# Patient Record
Sex: Male | Born: 1975 | Race: White | Hispanic: No | Marital: Married | Smoking: Never smoker
Health system: Southern US, Community
[De-identification: ages and names within clinical notes are randomized; demographics above are authoritative.]

## PROBLEM LIST (undated history)

## (undated) DIAGNOSIS — I10 Essential (primary) hypertension: Secondary | ICD-10-CM

## (undated) HISTORY — PX: ANTERIOR CRUCIATE LIGAMENT REPAIR: SHX115

## (undated) HISTORY — PX: ORIF RADIAL SHAFT FRACTURE: SUR957

## (undated) HISTORY — PX: OTHER SURGICAL HISTORY: SHX169

## (undated) HISTORY — PX: SMALL INTESTINE SURGERY: SHX150

---

## 2011-03-23 ENCOUNTER — Observation Stay (HOSPITAL_COMMUNITY)
Admission: EM | Admit: 2011-03-23 | Discharge: 2011-03-23 | Disposition: A | Discharge status: Home / Self Care | Payer: BC Managed Care – PPO | Attending: Emergency Medicine | Admitting: Emergency Medicine

## 2011-03-23 ENCOUNTER — Encounter (HOSPITAL_COMMUNITY): Payer: Self-pay | Admitting: Emergency Medicine

## 2011-03-23 DIAGNOSIS — T7840XA Allergy, unspecified, initial encounter: Principal | ICD-10-CM | POA: Insufficient documentation

## 2011-03-23 DIAGNOSIS — I10 Essential (primary) hypertension: Secondary | ICD-10-CM | POA: Insufficient documentation

## 2011-03-23 HISTORY — DX: Essential (primary) hypertension: I10

## 2011-03-23 LAB — POCT I-STAT, CHEM 8
BUN: 15 mg/dL (ref 6–23)
Chloride: 107 mEq/L (ref 96–112)
HCT: 51 % (ref 39.0–52.0)
Sodium: 140 mEq/L (ref 135–145)

## 2011-03-23 MED ORDER — DIPHENHYDRAMINE HCL 50 MG/ML IJ SOLN
50.0000 mg | Freq: Once | INTRAMUSCULAR | Status: AC
  Administered 2011-03-23: 50 mg via INTRAVENOUS
  Filled 2011-03-23: qty 1

## 2011-03-23 MED ORDER — ALBUTEROL (5 MG/ML) CONTINUOUS INHALATION SOLN
10.0000 mg/h | INHALATION_SOLUTION | RESPIRATORY_TRACT | Status: AC
  Administered 2011-03-23: 10 mg/h via RESPIRATORY_TRACT
  Filled 2011-03-23: qty 20

## 2011-03-23 MED ORDER — EPINEPHRINE 0.3 MG/0.3ML IJ DEVI
0.3000 mg | Freq: Once | INTRAMUSCULAR | Status: AC
  Administered 2011-03-23: 0.3 mg via INTRAMUSCULAR
  Filled 2011-03-23: qty 0.3

## 2011-03-23 MED ORDER — EPINEPHRINE 0.3 MG/0.3ML IJ DEVI: 0.3000 mg | INTRAMUSCULAR | Status: AC | PRN

## 2011-03-23 MED ORDER — SODIUM CHLORIDE 0.9 % IV BOLUS (SEPSIS)
1000.0000 mL | Freq: Once | INTRAVENOUS | Status: AC
  Administered 2011-03-23: 1000 mL via INTRAVENOUS

## 2011-03-23 MED ORDER — ALBUTEROL SULFATE (5 MG/ML) 0.5% IN NEBU
INHALATION_SOLUTION | RESPIRATORY_TRACT | Status: AC
  Administered 2011-03-23: 10 mg/h via RESPIRATORY_TRACT
  Filled 2011-03-23: qty 2

## 2011-03-23 MED ORDER — PREDNISONE 10 MG PO TABS: 20.0000 mg | ORAL_TABLET | Freq: Every day | ORAL | Status: DC

## 2011-03-23 MED ORDER — FAMOTIDINE IN NACL 20-0.9 MG/50ML-% IV SOLN
20.0000 mg | Freq: Once | INTRAVENOUS | Status: AC
  Administered 2011-03-23: 20 mg via INTRAVENOUS
  Filled 2011-03-23: qty 50

## 2011-03-23 MED ORDER — METHYLPREDNISOLONE SODIUM SUCC 125 MG IJ SOLR
125.0000 mg | Freq: Once | INTRAMUSCULAR | Status: AC
  Administered 2011-03-23: 125 mg via INTRAVENOUS
  Filled 2011-03-23: qty 2

## 2011-03-23 NOTE — ED Provider Notes (Signed)
Patient report received from my attending who has seen and evaluated the patient. 36 year old male presenting to the ED with an apparent allergic reaction to possible ACE inhibitor. The allergic reaction involve oral mucosa with swelling to lips and tongue.  Questionable airway involvement.  My attending has initiated allergic rxn protocol.  Pt currently in no apparent distress after initial treatment (pepcid, epi, solu-medrol, nebs, IVF, benadryl). On exam, lips with moderate angioedema, tongue mildly edematous, Heart with tachycardia but no M/R/G, lung CTAB, abd soft and nontender.   Pt will be moved to CDU on allergic RXN protocol for the next 6-8 hrs.  I discuss pt care with Felicie Morn, NP who will continue care  Fayrene Helper, PA-C 03/23/11 1532

## 2011-03-23 NOTE — ED Provider Notes (Signed)
Medical screening examination/treatment/procedure(s) were conducted as a shared visit with non-physician practitioner(s) and myself.  I personally evaluated the patient during the encounter   Lavette Yankovich A. Patrica Duel, MD 03/24/11 1958

## 2011-03-23 NOTE — ED Notes (Signed)
Dinner tray ordered. Ok per EDP

## 2011-03-23 NOTE — ED Provider Notes (Signed)
Angioedema has subsided.  Patient feeling better.  No sensation of tongue or oropharyngeal swelling.  Lungs CTA bilaterally.  Patient states he is ready for discharge home.  Will provide prescription for epipen. Patient to follow-up with his PCP [facility]) in the next few days.  Jimmye Norman, NP 03/23/11 2055

## 2011-03-23 NOTE — ED Provider Notes (Signed)
History     CSN: [id number]  Arrival date & time 03/23/11  1253   First MD Initiated Contact with Patient 03/23/11 1254      Chief Complaint  Patient presents with  . Allergic Reaction   this 36 year old man was upstairs visiting a family member in the hospital when he began to note symptoms suggestive of allergic reaction. He had diffuse redness in his arms and legs. He also began to have swelling in his face including his tongue and lips. He has been recently taking ibuprofen for her knee problem. He also is on a blood pressure medication, which he does not know the name of, but states she has not taken this in 2 weeks. Unknown whether or not. This may have been an ACE inhibitor. 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 He denies any chest pain, denies any difficulty with breathing, but appears to be somewhat uncomfortable, no acute distress. Initial vital signs are within normal limits other than mild tachycardia.  (Consider location/radiation/quality/duration/timing/severity/associated sxs/prior treatment) HPI  No past medical history on file.  No past surgical history on file.  No family history on file.  History  Substance Use Topics  . Smoking status: Not on file  . Smokeless tobacco: Not on file  . Alcohol Use: Not on file      Review of Systems  All other systems reviewed and are negative.    Allergies  Review of patient's allergies indicates not on file.  Home Medications  No current outpatient prescriptions on file.  BP 95/67  Pulse 125  Temp(Src) 98.2 F (36.8 C) (Oral)  Resp 18  SpO2 97%  Physical Exam  Nursing note and vitals reviewed. Constitutional: He is oriented to person, place, and time. He appears well-developed and well-nourished.       Morbidly obese, uncomfortable, but in no acute distress  HENT:  Head: Normocephalic and atraumatic.       Swelling of the lips and tongue, which is mild to  moderate. Mild puffiness around the eyes as well. The airway appears to be patent at this time  Eyes: Conjunctivae and EOM are normal. Pupils are equal, round, and reactive to light.  Neck: Neck supple.  Cardiovascular: Normal rate and regular rhythm.  Exam reveals no gallop and no friction rub.   No murmur heard. Pulmonary/Chest: Breath sounds normal. He has no wheezes. He has no rales. He exhibits no tenderness.  Abdominal: Soft. Bowel sounds are normal. He exhibits no distension. There is no tenderness. There is no rebound and no guarding.  Musculoskeletal: Normal range of motion.  Neurological: He is alert and oriented to person, place, and time. No cranial nerve deficit. Coordination normal.  Skin: Skin is warm and dry. Rash noted.       Mild to moderate diffuse erythema to the extremities.  Psychiatric: He has a normal mood and affect.    ED Course  Procedures (including critical care time)   Labs Reviewed  I-STAT, CHEM 8   No results found.   No diagnosis found.    MDM  Pt is seen and examined;  Initial history and physical completed.  Will follow.   CRITICAL CARE Performed by: Orlene Och.   Total critical care time: 30  Critical care time was exclusive of separately billable procedures and treating other patients.  Critical care was necessary to treat or prevent imminent or life-threatening deterioration.  Critical care was time spent personally by  me on the following activities: development of treatment plan with patient and/or surrogate as well as nursing, discussions with consultants, evaluation of patient's response to treatment, examination of patient, obtaining history from patient or surrogate, ordering and performing treatments and interventions, ordering and review of laboratory studies, ordering and review of radiographic studies, pulse oximetry and re-evaluation of patient's condition.      2:02 PM  Patient is showing improvement. Will place on  the CDU, allergic reaction protocol.  Will need further evaluation and observation   Results for orders placed during the hospital encounter of 03/23/11  POCT I-STAT, CHEM 8      Component Value Range   Sodium 140  135 - 145 (mEq/L)   Potassium 3.9  3.5 - 5.1 (mEq/L)   Chloride 107  96 - 112 (mEq/L)   BUN 15  6 - 23 (mg/dL)   Creatinine, Ser 1.61  0.50 - 1.35 (mg/dL)   Glucose, Bld 096 (*) 70 - 99 (mg/dL)   Calcium, Ion 0.45 (*) 1.12 - 1.32 (mmol/L)   TCO2 22  0 - 100 (mmol/L)   Hemoglobin 17.3 (*) 13.0 - 17.0 (g/dL)   HCT 40.9  81.1 - 91.4 (%)   No results found.     Simrit Gohlke A. Patrica Duel, MD 03/23/11 7829

## 2011-03-23 NOTE — ED Notes (Signed)
Visiting a family member in the hospital and developed itching in hands and bottom of feet,started breaking out with hives and lips and tongue started to swell,then developed difficulty breathing.

## 2011-03-24 NOTE — ED Provider Notes (Signed)
Medical screening examination/treatment/procedure(s) were conducted as a shared visit with non-physician practitioner(s) and myself.  I personally evaluated the patient during the encounter   Dominique Calvey A. Najee Cowens, MD 03/24/11 1459 

## 2017-08-25 ENCOUNTER — Ambulatory Visit (HOSPITAL_COMMUNITY)
Admission: EM | Admit: 2017-08-25 | Discharge: 2017-08-25 | Disposition: A | Discharge status: Home / Self Care | Payer: PRIVATE HEALTH INSURANCE | Attending: Emergency Medicine | Admitting: Emergency Medicine

## 2017-08-25 ENCOUNTER — Emergency Department (HOSPITAL_COMMUNITY): Payer: PRIVATE HEALTH INSURANCE | Admitting: Anesthesiology

## 2017-08-25 ENCOUNTER — Encounter (HOSPITAL_COMMUNITY): Payer: Self-pay | Admitting: Emergency Medicine

## 2017-08-25 ENCOUNTER — Emergency Department (HOSPITAL_COMMUNITY): Payer: PRIVATE HEALTH INSURANCE

## 2017-08-25 ENCOUNTER — Encounter (HOSPITAL_COMMUNITY)
Admission: EM | Disposition: A | Discharge status: Home / Self Care | Payer: Self-pay | Source: Home / Self Care | Attending: Emergency Medicine

## 2017-08-25 DIAGNOSIS — Z7982 Long term (current) use of aspirin: Secondary | ICD-10-CM | POA: Diagnosis not present

## 2017-08-25 DIAGNOSIS — Z79899 Other long term (current) drug therapy: Secondary | ICD-10-CM | POA: Insufficient documentation

## 2017-08-25 DIAGNOSIS — J95830 Postprocedural hemorrhage and hematoma of a respiratory system organ or structure following a respiratory system procedure: Secondary | ICD-10-CM | POA: Insufficient documentation

## 2017-08-25 DIAGNOSIS — E119 Type 2 diabetes mellitus without complications: Secondary | ICD-10-CM | POA: Insufficient documentation

## 2017-08-25 DIAGNOSIS — Z7984 Long term (current) use of oral hypoglycemic drugs: Secondary | ICD-10-CM | POA: Diagnosis not present

## 2017-08-25 DIAGNOSIS — I10 Essential (primary) hypertension: Secondary | ICD-10-CM | POA: Diagnosis not present

## 2017-08-25 HISTORY — PX: TONSILLECTOMY: SHX5217

## 2017-08-25 LAB — CBC WITH DIFFERENTIAL/PLATELET
Abs Immature Granulocytes: 0.2 10*3/uL — ABNORMAL HIGH (ref 0.0–0.1)
Basophils Absolute: 0.1 10*3/uL (ref 0.0–0.1)
Basophils Relative: 0 %
EOS ABS: 0.2 10*3/uL (ref 0.0–0.7)
EOS PCT: 1 %
HEMATOCRIT: 45.2 % (ref 39.0–52.0)
HEMOGLOBIN: 14.9 g/dL (ref 13.0–17.0)
Immature Granulocytes: 1 %
LYMPHS ABS: 4 10*3/uL (ref 0.7–4.0)
LYMPHS PCT: 21 %
MCH: 29.4 pg (ref 26.0–34.0)
MCHC: 33 g/dL (ref 30.0–36.0)
MCV: 89.2 fL (ref 78.0–100.0)
MONO ABS: 1.6 10*3/uL — AB (ref 0.1–1.0)
Monocytes Relative: 8 %
Neutro Abs: 12.7 10*3/uL — ABNORMAL HIGH (ref 1.7–7.7)
Neutrophils Relative %: 69 %
Platelets: 294 10*3/uL (ref 150–400)
RBC: 5.07 MIL/uL (ref 4.22–5.81)
RDW: 12.5 % (ref 11.5–15.5)
WBC: 18.8 10*3/uL — ABNORMAL HIGH (ref 4.0–10.5)

## 2017-08-25 LAB — BASIC METABOLIC PANEL
Anion gap: 10 (ref 5–15)
BUN: 11 mg/dL (ref 6–20)
CALCIUM: 9 mg/dL (ref 8.9–10.3)
CHLORIDE: 94 mmol/L — AB (ref 101–111)
CO2: 29 mmol/L (ref 22–32)
CREATININE: 0.92 mg/dL (ref 0.61–1.24)
GFR calc Af Amer: >60 mL/min (ref >60)
GFR calc non Af Amer: >60 mL/min (ref >60)
GLUCOSE: 207 mg/dL — AB (ref 65–99)
Potassium: 3.7 mmol/L (ref 3.5–5.1)
Sodium: 133 mmol/L — ABNORMAL LOW (ref 135–145)

## 2017-08-25 LAB — GLUCOSE, CAPILLARY: Glucose-Capillary: 206 mg/dL — ABNORMAL HIGH (ref 65–99)

## 2017-08-25 SURGERY — TONSILLECTOMY
Anesthesia: General

## 2017-08-25 MED ORDER — DEXTROSE 5 % IV SOLN
3.0000 g | Freq: Once | INTRAVENOUS | Status: DC
  Filled 2017-08-25: qty 3000

## 2017-08-25 MED ORDER — PROPOFOL 10 MG/ML IV BOLUS
INTRAVENOUS | Status: AC
  Filled 2017-08-25: qty 40

## 2017-08-25 MED ORDER — DEXTROSE 5 % IV SOLN
INTRAVENOUS | Status: DC | PRN
  Administered 2017-08-25: 3 g via INTRAVENOUS

## 2017-08-25 MED ORDER — DEXTROSE 5 % IV SOLN: 3000.0000 mg | Freq: Once | INTRAVENOUS | Status: DC

## 2017-08-25 MED ORDER — DEXAMETHASONE SODIUM PHOSPHATE 10 MG/ML IJ SOLN
INTRAMUSCULAR | Status: DC | PRN
  Administered 2017-08-25: 10 mg via INTRAVENOUS

## 2017-08-25 MED ORDER — FENTANYL CITRATE (PF) 250 MCG/5ML IJ SOLN
INTRAMUSCULAR | Status: AC
  Filled 2017-08-25: qty 5

## 2017-08-25 MED ORDER — LACTATED RINGERS IV SOLN
INTRAVENOUS | Status: DC | PRN
  Administered 2017-08-25: 16:00:00 via INTRAVENOUS

## 2017-08-25 MED ORDER — LIDOCAINE HCL (CARDIAC) PF 100 MG/5ML IV SOSY
PREFILLED_SYRINGE | INTRAVENOUS | Status: DC | PRN
  Administered 2017-08-25: 60 mg via INTRATRACHEAL

## 2017-08-25 MED ORDER — PROPOFOL 10 MG/ML IV BOLUS
INTRAVENOUS | Status: DC | PRN
  Administered 2017-08-25: 80 mg via INTRAVENOUS
  Administered 2017-08-25: 200 mg via INTRAVENOUS

## 2017-08-25 MED ORDER — DEXAMETHASONE SODIUM PHOSPHATE 10 MG/ML IJ SOLN: 10.0000 mg | Freq: Once | INTRAMUSCULAR | Status: DC

## 2017-08-25 MED ORDER — CEFAZOLIN SODIUM-DEXTROSE 1-4 GM/50ML-% IV SOLN
INTRAVENOUS | Status: AC
  Filled 2017-08-25: qty 50

## 2017-08-25 MED ORDER — 0.9 % SODIUM CHLORIDE (POUR BTL) OPTIME
TOPICAL | Status: DC | PRN
  Administered 2017-08-25: 1000 mL

## 2017-08-25 MED ORDER — MIDAZOLAM HCL 2 MG/2ML IJ SOLN
INTRAMUSCULAR | Status: AC
  Filled 2017-08-25: qty 2

## 2017-08-25 MED ORDER — FENTANYL CITRATE (PF) 250 MCG/5ML IJ SOLN
INTRAMUSCULAR | Status: DC | PRN
  Administered 2017-08-25: 50 ug via INTRAVENOUS

## 2017-08-25 MED ORDER — SUCCINYLCHOLINE CHLORIDE 20 MG/ML IJ SOLN
INTRAMUSCULAR | Status: DC | PRN
  Administered 2017-08-25: 20 mg via INTRAVENOUS
  Administered 2017-08-25: 120 mg via INTRAVENOUS

## 2017-08-25 MED ORDER — CEFAZOLIN SODIUM-DEXTROSE 2-4 GM/100ML-% IV SOLN
INTRAVENOUS | Status: AC
  Filled 2017-08-25: qty 100

## 2017-08-25 MED ORDER — FENTANYL CITRATE (PF) 100 MCG/2ML IJ SOLN: 25.0000 ug | INTRAMUSCULAR | Status: DC | PRN

## 2017-08-25 MED ORDER — MIDAZOLAM HCL 5 MG/5ML IJ SOLN
INTRAMUSCULAR | Status: DC | PRN
  Administered 2017-08-25: 1 mg via INTRAVENOUS

## 2017-08-25 MED ORDER — ONDANSETRON HCL 4 MG/2ML IJ SOLN
INTRAMUSCULAR | Status: DC | PRN
  Administered 2017-08-25: 4 mg via INTRAVENOUS

## 2017-08-25 SURGICAL SUPPLY — 30 items
BLADE SURG 15 STRL LF DISP TIS (BLADE) IMPLANT
BLADE SURG 15 STRL SS (BLADE)
CATH ROBINSON RED A/P 12FR (CATHETERS) IMPLANT
CLEANER TIP ELECTROSURG 2X2 (MISCELLANEOUS) IMPLANT
COAGULATOR SUCT 6 FR SWTCH (ELECTROSURGICAL) ×1
COAGULATOR SUCT SWTCH 10FR 6 (ELECTROSURGICAL) ×2 IMPLANT
DRAPE HALF SHEET 40X57 (DRAPES) IMPLANT
ELECT COATED BLADE 2.86 ST (ELECTRODE) ×3 IMPLANT
ELECT REM PT RETURN 9FT ADLT (ELECTROSURGICAL) ×3
ELECT REM PT RETURN 9FT PED (ELECTROSURGICAL)
ELECTRODE REM PT RETRN 9FT PED (ELECTROSURGICAL) IMPLANT
ELECTRODE REM PT RTRN 9FT ADLT (ELECTROSURGICAL) ×1 IMPLANT
GAUZE SPONGE 4X4 16PLY XRAY LF (GAUZE/BANDAGES/DRESSINGS) ×3 IMPLANT
GLOVE BIOGEL M 7.0 STRL (GLOVE) ×3 IMPLANT
GOWN STRL REUS W/ TWL LRG LVL3 (GOWN DISPOSABLE) ×2 IMPLANT
GOWN STRL REUS W/TWL LRG LVL3 (GOWN DISPOSABLE) ×4
KIT BASIN OR (CUSTOM PROCEDURE TRAY) ×3 IMPLANT
KIT TURNOVER KIT B (KITS) ×3 IMPLANT
NEEDLE HYPO 25GX1X1/2 BEV (NEEDLE) IMPLANT
NS IRRIG 1000ML POUR BTL (IV SOLUTION) ×3 IMPLANT
PACK SURGICAL SETUP 50X90 (CUSTOM PROCEDURE TRAY) ×3 IMPLANT
PAD ARMBOARD 7.5X6 YLW CONV (MISCELLANEOUS) ×6 IMPLANT
PENCIL BUTTON HOLSTER BLD 10FT (ELECTRODE) ×3 IMPLANT
SPECIMEN JAR SMALL (MISCELLANEOUS) IMPLANT
SPONGE TONSIL 1 RF SGL (DISPOSABLE) ×3 IMPLANT
SYR BULB 3OZ (MISCELLANEOUS) ×3 IMPLANT
TOWEL NATURAL 10PK STERILE (DISPOSABLE) ×3 IMPLANT
TOWEL OR 17X24 6PK STRL BLUE (TOWEL DISPOSABLE) IMPLANT
TUBE SALEM SUMP 16 FR W/ARV (TUBING) ×3 IMPLANT
WATER STERILE IRR 1000ML POUR (IV SOLUTION) IMPLANT

## 2017-08-25 NOTE — Transfer of Care (Signed)
Immediate Anesthesia Transfer of Care Note  Patient: Hayden Burke  Procedure(s) Performed: CONTROL OF POST TONSILLAR BLEED (N/A )  Patient Location: PACU  Anesthesia Type:General  Level of Consciousness: awake  Airway & Oxygen Therapy: Patient Spontanous Breathing  Post-op Assessment: Report given to RN and Post -op Vital signs reviewed and stable  Post vital signs: Reviewed and stable  Last Vitals:  Vitals Value Taken Time  BP 144/96 08/25/2017  5:22 PM  Temp    Pulse 97 08/25/2017  5:27 PM  Resp 15 08/25/2017  5:27 PM  SpO2 93 % 08/25/2017  5:27 PM  Vitals shown include unvalidated device data.  Last Pain:  Vitals:   08/25/17 1516  PainSc: 6          Complications: No apparent anesthesia complications

## 2017-08-25 NOTE — ED Triage Notes (Signed)
Pt states he had a tonsillectomy on Friday, has not been bleeding since then but had a coughing fit and then started bleeding, which made him vomit a large amount of blood. No bleeding currently. Pt felt dizzy during the episode but does not currently.

## 2017-08-25 NOTE — ED Notes (Signed)
Pt st's throat was bleeding a lot at home.  No active bleeding present at this time

## 2017-08-25 NOTE — Op Note (Signed)
Operative Note: Control post tonsillectomy hemorrhage  Patient: Hayden Burke  Medical record number: [id number]  Date:08/25/2017  Pre-operative Indications: Acute oropharyngeal hemorrhage  Postoperative Indications: Same  Surgical Procedure: Control post tonsillectomy hemorrhage  Anesthesia: GET  Surgeon: Barbee Coughavid L Luvern Mcisaac, M.D.  Assist: None  Complications: None  EBL: None   Brief History: The patient is a 42 y.o. male with a history of significant tonsil hypertrophy and newly diagnosed obstructive sleep apnea.  The patient [facility] on 08/23/2017.  He developed acute hemorrhage and [facility] for evaluation.. Given the patient's history and findings I recommended lamination under anesthesia and control of post tonsillectomy hemorrhage under general anesthesia, risks and benefits were discussed in detail with the patient and her family. They understand and agree with our plan for surgery which is [facility] on an emergency basis.  Surgical Procedure: The patient is brought to the operating room on 08/25/2017 and placed in supine position on the operating table. General endotracheal anesthesia was established without difficulty. When the patient was adequately anesthetized, surgical timeout was performed and correct identification of the patient and the surgical procedure. The patient was positioned and prepped and draped in sterile fashion.  With the patient's airway stable a Crowe-Davis mouthgag was inserted without difficulty, there were no loose or broken teeth.  The patient had significant clotting in the oropharynx and tonsillar fossa bilaterally.  This was gently removed and an arterial bleeding site was noted on the inferior aspect of the left tonsil fossa.  This was thoroughly cauterized with monopolar suction cautery.  The tonsillar fossa were then gently abraded with  a dry tonsil sponge and several other areas of point hemorrhage were cauterized.  The mouthgag was released and reapplied, no active bleeding.  An orogastric tube was then passed in the patient's stomach contents were aspirated including significant swallowed blood.  A warm saline gastric lavage was then undertaken with a proximate 200 cc of saline to clear the stomach of additional clotted material.  The patient was awakened from anesthetic and transferred from the operating room to the recovery room in stable condition. There were no complications and blood loss was minimal.   Barbee Coughavid L Dvante Hands, M.D. [facility] 08/25/2017

## 2017-08-25 NOTE — Anesthesia Preprocedure Evaluation (Signed)
Anesthesia Evaluation  Patient identified by MRN, date of birth, ID band Patient awake    Reviewed: Allergy & Precautions, NPO status , Patient's Chart, lab work & pertinent test results  Airway Mallampati: II  TM Distance: >3 FB     Dental   Pulmonary    breath sounds clear to auscultation       Cardiovascular hypertension,  Rhythm:Regular Rate:Normal     Neuro/Psych    GI/Hepatic negative GI ROS, Neg liver ROS,   Endo/Other  diabetes  Renal/GU negative Renal ROS     Musculoskeletal   Abdominal   Peds  Hematology   Anesthesia Other Findings   Reproductive/Obstetrics                             Anesthesia Physical Anesthesia Plan  ASA: III  Anesthesia Plan: General   Post-op Pain Management:    Induction: Intravenous, Rapid sequence and Cricoid pressure planned  PONV Risk Score and Plan: Treatment may vary due to age or medical condition, Ondansetron, Dexamethasone and Midazolam  Airway Management Planned: Oral ETT and Video Laryngoscope Planned  Additional Equipment:   Intra-op Plan:   Post-operative Plan: Possible Post-op intubation/ventilation  Informed Consent: I have reviewed the patients History and Physical, chart, labs and discussed the procedure including the risks, benefits and alternatives for the proposed anesthesia with the patient or authorized representative who has indicated his/her understanding and acceptance.   Dental advisory given  Plan Discussed with: CRNA, Anesthesiologist and Surgeon  Anesthesia Plan Comments:         Anesthesia Quick Evaluation

## 2017-08-25 NOTE — Anesthesia Procedure Notes (Signed)
Procedure Name: Intubation Date/Time: 08/25/2017 4:55 PM Performed by: Claudina LickMahony, Avir Deruiter D, CRNA Pre-anesthesia Checklist: Patient identified, Emergency Drugs available, Suction available, Patient being monitored and Timeout performed Patient Re-evaluated:Patient Re-evaluated prior to induction Oxygen Delivery Method: Circle system utilized Preoxygenation: Pre-oxygenation with 100% oxygen Induction Type: IV induction, Rapid sequence and Cricoid Pressure applied Laryngoscope Size: Glidescope, Miller and 2 (Grade 2 with Glidescope- unable to manipulate ETT past large clot. Grade 2/3 with Hyacinth MeekerMiller 2- able to pass ETT) Grade View: Grade II Tube type: Oral Tube size: 7.5 mm Number of attempts: 2 (See note) Airway Equipment and Method: Stylet and Video-laryngoscopy Placement Confirmation: ETT inserted through vocal cords under direct vision,  positive ETCO2 and breath sounds checked- equal and bilateral Secured at: 23 cm Tube secured with: Tape Dental Injury: Teeth and Oropharynx as per pre-operative assessment

## 2017-08-25 NOTE — Anesthesia Postprocedure Evaluation (Signed)
Anesthesia Post Note  Patient: Hayden Burke  Procedure(s) Performed: CONTROL OF POST TONSILLAR BLEED (N/A )     Patient location during evaluation: PACU Anesthesia Type: General Level of consciousness: awake Pain management: pain level controlled Vital Signs Assessment: post-procedure vital signs reviewed and stable Respiratory status: spontaneous breathing Cardiovascular status: stable Anesthetic complications: no    Last Vitals:  Vitals:   08/25/17 1750 08/25/17 1752  BP:  119/75  Pulse:  (!) 104  Resp:  19  Temp: 37.8 C   SpO2:  93%    Last Pain:  Vitals:   08/25/17 1750  PainSc: 3                  Hayden Burke

## 2017-08-25 NOTE — ED Provider Notes (Signed)
[facility] EMERGENCY DEPARTMENT Provider Note   CSN: [id number] Arrival date & time: 08/25/17  1449     History   Chief Complaint No chief complaint on file.   HPI Hayden NickelRusty Dorsch is a 42 y.o. male.  HPI   Hayden Burke is a 42 y.o. male, with a history of DM and HTN, presenting to the ED with bleeding from the throat that began shortly prior to arrival.  Patient underwent tonsillectomy performed by Dr. Nicanor BakeA. Tanner with [facility] on June 14.  Patient had no bleeding until this afternoon.  States he began to cough and then  he had a large amount of bleeding.  The bleeding slowed and stopped prior to checking into the ED.  Early into his ED course, however, he began to bleed again.  Denies LOC, increased throat pain, shortness of breath, chest pain, fever, or any other complaints.      Past Medical History:  Diagnosis Date  . Diabetes mellitus   . Hypertension     Patient Active Problem List   Diagnosis Date Noted  . Post-tonsillectomy hemorrhage 08/25/2017          Home Medications    Prior to Admission medications   Medication Sig Start Date End Date Taking? Authorizing Provider  acetaminophen (TYLENOL) 325 MG tablet Take 650 mg by mouth every 4 (four) hours as needed. For pain.    [provider]  aspirin EC 81 MG tablet Take 81 mg by mouth daily as needed. For pain.    [provider]  EPINEPHrine (EPIPEN) 0.3 mg/0.3 mL DEVI Inject 0.3 mLs (0.3 mg total) into the muscle as needed. 03/23/11   Felicie MornSmith, David, NP  hydrochlorothiazide (HYDRODIURIL) 25 MG tablet Take 25 mg by mouth daily.    [provider]  ibuprofen (ADVIL,MOTRIN) 200 MG tablet Take 800 mg by mouth every 6 (six) hours as needed. For pain.    [provider]  lisinopril (PRINIVIL,ZESTRIL) 20 MG tablet Take 20 mg by mouth daily.    [provider]  metFORMIN (GLUCOPHAGE-XR) 500 MG 24 hr tablet Take 1,000 mg by mouth daily.     [provider]  predniSONE (DELTASONE) 10 MG tablet Take 2 tablets (20 mg total) by mouth daily. 03/23/11   Felicie MornSmith, David, NP  ranitidine (ZANTAC) 150 MG tablet Take 150 mg by mouth daily.    [provider]    Family History History reviewed. No pertinent family history.  Social History Social History   Tobacco Use  . Smoking status: Never Smoker  Substance Use Topics  . Alcohol use: Yes  . Drug use: Not on file     Allergies   Ace inhibitors; Erythromycin; Food; and Sulfa antibiotics   Review of Systems Review of Systems  Constitutional: Negative for diaphoresis and fever.  HENT:       Bleeding from tonsillectomy site  Respiratory: Positive for cough. Negative for shortness of breath.   Cardiovascular: Negative for chest pain.  Gastrointestinal: Positive for nausea and vomiting. Negative for abdominal pain.  All other systems reviewed and are negative.    Physical Exam Updated Vital Signs BP 126/77   Pulse 98   Temp 98.3 F (36.8 C)   Resp 19   Ht 6\' 1"  (1.854 m)   Wt 115.7 kg (255 lb)   SpO2 99%   BMI 33.64 kg/m   Physical Exam  Constitutional: He appears well-developed and well-nourished. No distress.  HENT:  Head: Normocephalic  and atraumatic.  Patient has active bleeding that appears to be originating from the right tonsillectomy site.  He is able to control his own airway.  Eyes: Conjunctivae are normal.  Neck: Neck supple.  Cardiovascular: Normal rate, regular rhythm, normal heart sounds and intact distal pulses.  Pulmonary/Chest: Effort normal and breath sounds normal. No respiratory distress.  Abdominal: Soft. There is no tenderness. There is no guarding.  Musculoskeletal: He exhibits no edema.  Lymphadenopathy:    He has no cervical adenopathy.  Neurological: He is alert.  Skin: Skin is warm and dry. He is not diaphoretic.  Psychiatric: He has a normal mood and affect. His behavior is normal.  Nursing note and vitals  reviewed.    ED Treatments / Results  Labs (all labs ordered are listed, but only abnormal results are displayed) Labs Reviewed  CBC WITH DIFFERENTIAL/PLATELET - Abnormal; Notable for the following components:      Result Value   WBC 18.8 (*)    Neutro Abs 12.7 (*)    Monocytes Absolute 1.6 (*)    Abs Immature Granulocytes 0.2 (*)    All other components within normal limits  BASIC METABOLIC PANEL - Abnormal; Notable for the following components:   Sodium 133 (*)    Chloride 94 (*)    Glucose, Bld 207 (*)    All other components within normal limits  GLUCOSE, CAPILLARY - Abnormal; Notable for the following components:   Glucose-Capillary 206 (*)    All other components within normal limits    EKG None  Radiology Dg Chest Portable 1 View  Result Date: 08/25/2017 CLINICAL DATA:  42 y/o M; post tonsillectomy bleeding with concern for aspiration. EXAM: PORTABLE CHEST 1 VIEW COMPARISON:  None. FINDINGS: Normal cardiac silhouette given projection and technique. Bronchitic changes within the left lung base may represent aspiration. No consolidation. No pleural effusion or pneumothorax. Bones are unremarkable. IMPRESSION: Bronchitic changes in the left lung base may represent aspiration. No consolidation. Electronically Signed   By: Mitzi Hansen M.D.   On: 08/25/2017 16:03    Procedures Procedures (including critical care time)  Medications Ordered in ED Medications  ceFAZolin (ANCEF) 1-4 GM/50ML-% IVPB (has no administration in time range)  ceFAZolin (ANCEF) 2-4 GM/100ML-% IVPB (has no administration in time range)  dexamethasone (DECADRON) injection 10 mg ( Intravenous Automatically Held 08/25/17 1745)  fentaNYL (SUBLIMAZE) injection 25-50 mcg (has no administration in time range)     Initial Impression / Assessment and Plan / ED Course  I have reviewed the triage vital signs and the nursing notes.  Pertinent labs & imaging results that were available during my  care of the patient were reviewed by me and considered in my medical decision making (see chart for details).  Clinical Course as of Aug 25 1799  Sun Aug 25, 2017  1545 Spoke with Dr. Annalee Genta.  States he will take the patient to the OR.  Requests CBC and BMP, no type and screen necessary. He will meet the patient in the pre-op area to discuss the procedure.   [SJ]    Clinical Course User Index [SJ] Joy, Shawn C, PA-C    Patient presents with bleeding from his tonsillectomy site.  Able to control his airway in the ED, however, he will be taken to the OR by ENT to address the hemorrhage.  Findings and plan of care discussed with Loren Racer, MD. Dr. Ranae Palms personally evaluated and examined this patient.     Final Clinical Impressions(s) / ED  Diagnoses   Final diagnoses:  Hemorrhage following tonsillectomy    ED Discharge Orders        Ordered    Increase activity slowly     08/25/17 1722    Diet - low sodium heart healthy     08/25/17 1722    Discharge instructions    Comments:  Tonsillectomy Care After Refer to this sheet in the next few weeks. These instructions provide you with information on caring for yourself after your procedure. Your caregiver may also give you specific instructions. Your treatment has been planned according to current medical practices, but problems sometimes occur. Call your caregiver if you have any problems or questions after your procedure. HOME CARE INSTRUCTIONS  Obtain proper rest, keeping your head elevated at all times. You will feel worn out and tired for a while.  Drink plenty of fluids. This reduces pain and hastens the healing process.  Only take over-the-counter or prescription medicines for pain, discomfort, or fever as directed by your caregiver. Do not take aspirin or nonsteroidal anti-inflammatory drugs. These medications increase the possibility of bleeding.  Sometimes the use of pain medication can cause constipation. If this  happens, ask your caregiver about laxatives that you can take.  When eating, only eat a small portion of your food and then take your prescribed pain medication. Eat the remainder of your food 45 minutes later. This will make swallowing less painful.  Soft and cold foods, such as gelatin, sherbet, ice cream, frozen ice pops, and cold drinks, are usually the easiest to eat. Several days after surgery, you will be able to eat more solid food.  Avoid mouth washes and gargles.  Avoid contact with people who have upper respiratory infections, such as colds and sore throats.  An ice pack applied to your neck may help with discomfort and keep swelling down.  SEEK MEDICAL CARE IF:  You have increasing pain that is not controlled with medications.  You have an oral temperature above 102 F (38.9 C).  You feel lightheaded or have a fainting spell.  You develop a rash.  SEEK IMMEDIATE MEDICAL CARE IF:  You have difficulty breathing.  You experience side effects or allergic reactions to medications.  You bleed bright red blood from your throat, or you vomit bright red blood.  MAKE SURE YOU: Understand these instructions.  Will watch your condition.  Will get help right away if you are not doing well or get worse.   Alternate ibuprofen and Tylenol every 6 hours as needed for pain management.  CALL your primary ENT surgeon at [facility] for any questions or Emergency concerns.  Follow-up as scheduled at [facility] for postoperative recheck.   08/25/17 1722       Anselm Pancoast, PA-C 08/25/17 1802    Loren Racer, MD 08/25/17 1815

## 2017-08-25 NOTE — Consult Note (Signed)
ENT CONSULT:  Reason for Consult: Acute post tonsillectomy hemorrhage Referring Physician: EDP  Hayden Burke is an 42 y.o. male.  HPI: The patient presents to the [facility] emergency department with acute hemorrhage.  He underwent tonsillectomy on 08/23/2017 at [facility].  The patient has a history of significant tonsillar hypertrophy and recurrent infection.  He was diagnosed with moderately severe obstructive sleep apnea.  The patient developed acute hemorrhage earlier today with oropharyngeal bleeding.  He was brought by EMS to [facility] emergency department for evaluation and work-up.  Past Medical History:  Diagnosis Date  . Diabetes mellitus   . Hypertension       No family history on file.  Social History:  reports that he has never smoked. He does not have any smokeless tobacco history on file. He reports that he drinks alcohol. His drug history is not on file.  Allergies:  Allergies  Allergen Reactions  . Ace Inhibitors   . Erythromycin Hives and Nausea And Vomiting  . Food     Strawberries.  . Sulfa Antibiotics     Medications: I have reviewed the patient's current medications.  Results for orders placed or performed during the hospital encounter of 08/25/17 (from the past 48 hour(s))  CBC with Differential     Status: Abnormal   Collection Time: 08/25/17  3:47 PM  Result Value Ref Range   WBC 18.8 (H) 4.0 - 10.5 K/uL   RBC 5.07 4.22 - 5.81 MIL/uL   Hemoglobin 14.9 13.0 - 17.0 g/dL   HCT 45.2 39.0 - 52.0 %   MCV 89.2 78.0 - 100.0 fL   MCH 29.4 26.0 - 34.0 pg   MCHC 33.0 30.0 - 36.0 g/dL   RDW 12.5 11.5 - 15.5 %   Platelets 294 150 - 400 K/uL   Neutrophils Relative % 69 %   Neutro Abs 12.7 (H) 1.7 - 7.7 K/uL   Lymphocytes Relative 21 %   Lymphs Abs 4.0 0.7 - 4.0 K/uL   Monocytes Relative 8 %   Monocytes Absolute 1.6 (H) 0.1 - 1.0 K/uL   Eosinophils Relative 1 %   Eosinophils Absolute 0.2 0.0 - 0.7 K/uL   Basophils Relative 0 %    Basophils Absolute 0.1 0.0 - 0.1 K/uL   Immature Granulocytes 1 %   Abs Immature Granulocytes 0.2 (H) 0.0 - 0.1 K/uL    Comment: Performed at [facility], [address] [place], [place] [address]  Basic metabolic panel     Status: Abnormal   Collection Time: 08/25/17  3:47 PM  Result Value Ref Range   Sodium 133 (L) 135 - 145 mmol/L   Potassium 3.7 3.5 - 5.1 mmol/L   Chloride 94 (L) 101 - 111 mmol/L   CO2 29 22 - 32 mmol/L   Glucose, Bld 207 (H) 65 - 99 mg/dL   BUN 11 6 - 20 mg/dL   Creatinine, Ser 0.92 0.61 - 1.24 mg/dL   Calcium 9.0 8.9 - 10.3 mg/dL   GFR calc non Af Amer >60 >60 mL/min   GFR calc Af Amer >60 >60 mL/min    Comment: (NOTE) The eGFR has been calculated using the CKD EPI equation. This calculation has not been validated in all clinical situations. eGFR's persistently <60 mL/min signify possible Chronic Kidney Disease.    Anion gap 10 5 - 15    Comment: Performed at [facility] [address] [place], [place] [address]  Dg Chest Portable 1 View  Result Date: 08/25/2017 CLINICAL DATA:  42 y/o M; post tonsillectomy bleeding with concern for aspiration. EXAM: PORTABLE CHEST 1 VIEW COMPARISON:  None. FINDINGS: Normal cardiac silhouette given projection and technique. Bronchitic changes within the left lung base may represent aspiration. No consolidation. No pleural effusion or pneumothorax. Bones are unremarkable. IMPRESSION: Bronchitic changes in the left lung base may represent aspiration. No consolidation. Electronically Signed   By: Kristine Garbe M.D.   On: 08/25/2017 16:03    ROS:ROS 12 systems reviewed and negative except as stated in HPI   Blood pressure 128/85, pulse 99, temperature 98.3 F (36.8 C), resp. rate (!) 23, height 6' 1"  (1.854 m), weight 115.7 kg (255 lb), SpO2 96 %.  PHYSICAL EXAM: General appearance - alert, well appearing, and in no distress Mental status - alert, oriented to person, place, and time Mouth -  mucous membranes moist, pharynx normal without lesions and Post tonsillectomy pharyngeal defect with clot on the right.  Some minimal bleeding. Neck - supple, no significant adenopathy  Studies Reviewed: Blood work-normal H&H  Assessment/Plan: Patient presents to the emergency department with symptoms of acute post tonsillectomy hemorrhage.  He is 2 days after surgery performed at [facility].  The patient had significant oropharyngeal bleeding examination shows clotted blood in the right tonsillar fossa.  Given the patient's history and findings I recommended examination under anesthesia with control of his acute bleeding.  Risks and benefits of this procedure were discussed in detail with the patient is wife and they understand and agree with the plan for surgery which is scheduled on emergency basis at [facility], [place] 08/25/2017, 4:26 PM

## 2017-08-25 NOTE — ED Notes (Signed)
Pt st's he needed to cough, when he did bleeding started again.

## 2017-08-26 ENCOUNTER — Encounter (HOSPITAL_COMMUNITY): Payer: Self-pay | Admitting: Otolaryngology

## 2019-09-08 ENCOUNTER — Emergency Department
Admission: EM | Admit: 2019-09-08 | Discharge: 2019-09-08 | Disposition: A | Discharge status: Home / Self Care | Payer: No Typology Code available for payment source | Attending: Student in an Organized Health Care Education/Training Program | Admitting: Student in an Organized Health Care Education/Training Program

## 2019-09-08 ENCOUNTER — Other Ambulatory Visit: Payer: Self-pay

## 2019-09-08 ENCOUNTER — Emergency Department: Payer: No Typology Code available for payment source

## 2019-09-08 DIAGNOSIS — R1010 Upper abdominal pain, unspecified: Secondary | ICD-10-CM | POA: Insufficient documentation

## 2019-09-08 DIAGNOSIS — Y999 Unspecified external cause status: Secondary | ICD-10-CM | POA: Insufficient documentation

## 2019-09-08 DIAGNOSIS — Y92410 Unspecified street and highway as the place of occurrence of the external cause: Secondary | ICD-10-CM | POA: Diagnosis not present

## 2019-09-08 DIAGNOSIS — S20219A Contusion of unspecified front wall of thorax, initial encounter: Secondary | ICD-10-CM | POA: Insufficient documentation

## 2019-09-08 DIAGNOSIS — Z7982 Long term (current) use of aspirin: Secondary | ICD-10-CM | POA: Diagnosis not present

## 2019-09-08 DIAGNOSIS — I10 Essential (primary) hypertension: Secondary | ICD-10-CM | POA: Diagnosis not present

## 2019-09-08 DIAGNOSIS — Z79899 Other long term (current) drug therapy: Secondary | ICD-10-CM | POA: Diagnosis not present

## 2019-09-08 DIAGNOSIS — E119 Type 2 diabetes mellitus without complications: Secondary | ICD-10-CM | POA: Insufficient documentation

## 2019-09-08 DIAGNOSIS — R519 Headache, unspecified: Secondary | ICD-10-CM | POA: Diagnosis not present

## 2019-09-08 DIAGNOSIS — Z7984 Long term (current) use of oral hypoglycemic drugs: Secondary | ICD-10-CM | POA: Diagnosis not present

## 2019-09-08 DIAGNOSIS — M79642 Pain in left hand: Secondary | ICD-10-CM | POA: Diagnosis not present

## 2019-09-08 DIAGNOSIS — Y9389 Activity, other specified: Secondary | ICD-10-CM | POA: Insufficient documentation

## 2019-09-08 DIAGNOSIS — S29001A Unspecified injury of muscle and tendon of front wall of thorax, initial encounter: Secondary | ICD-10-CM | POA: Diagnosis present

## 2019-09-08 LAB — CBC WITH DIFFERENTIAL/PLATELET
Abs Immature Granulocytes: 0.07 10*3/uL (ref 0.00–0.07)
Basophils Absolute: 0.1 10*3/uL (ref 0.0–0.1)
Basophils Relative: 1 %
Eosinophils Absolute: 0.5 10*3/uL (ref 0.0–0.5)
Eosinophils Relative: 5 %
HCT: 44.2 % (ref 39.0–52.0)
Hemoglobin: 15.2 g/dL (ref 13.0–17.0)
Immature Granulocytes: 1 %
Lymphocytes Relative: 29 %
Lymphs Abs: 3 10*3/uL (ref 0.7–4.0)
MCH: 30 pg (ref 26.0–34.0)
MCHC: 34.4 g/dL (ref 30.0–36.0)
MCV: 87.2 fL (ref 80.0–100.0)
Monocytes Absolute: 0.9 10*3/uL (ref 0.1–1.0)
Monocytes Relative: 9 %
Neutro Abs: 6 10*3/uL (ref 1.7–7.7)
Neutrophils Relative %: 55 %
Platelets: 239 10*3/uL (ref 150–400)
RBC: 5.07 MIL/uL (ref 4.22–5.81)
RDW: 13 % (ref 11.5–15.5)
WBC: 10.5 10*3/uL (ref 4.0–10.5)
nRBC: 0 % (ref 0.0–0.2)

## 2019-09-08 LAB — BASIC METABOLIC PANEL
Anion gap: 10 (ref 5–15)
BUN: 11 mg/dL (ref 6–20)
CO2: 25 mmol/L (ref 22–32)
Calcium: 8.8 mg/dL — ABNORMAL LOW (ref 8.9–10.3)
Chloride: 102 mmol/L (ref 98–111)
Creatinine, Ser: 0.79 mg/dL (ref 0.61–1.24)
GFR calc Af Amer: >60 mL/min (ref >60)
GFR calc non Af Amer: >60 mL/min (ref >60)
Glucose, Bld: 103 mg/dL — ABNORMAL HIGH (ref 70–99)
Potassium: 3.5 mmol/L (ref 3.5–5.1)
Sodium: 137 mmol/L (ref 135–145)

## 2019-09-08 LAB — TROPONIN I (HIGH SENSITIVITY): Troponin I (High Sensitivity): 7 ng/L (ref <18)

## 2019-09-08 MED ORDER — IOHEXOL 300 MG/ML  SOLN
125.0000 mL | Freq: Once | INTRAMUSCULAR | Status: AC | PRN
  Administered 2019-09-08: 125 mL via INTRAVENOUS
  Filled 2019-09-08: qty 125

## 2019-09-08 MED ORDER — MELOXICAM 15 MG PO TABS: 15.0000 mg | ORAL_TABLET | Freq: Every day | ORAL | 2 refills | Status: AC

## 2019-09-08 MED ORDER — ONDANSETRON HCL 4 MG/2ML IJ SOLN
4.0000 mg | Freq: Once | INTRAMUSCULAR | Status: AC
  Administered 2019-09-08: 4 mg via INTRAVENOUS
  Filled 2019-09-08: qty 2

## 2019-09-08 MED ORDER — MORPHINE SULFATE (PF) 4 MG/ML IV SOLN
4.0000 mg | Freq: Once | INTRAVENOUS | Status: AC
  Administered 2019-09-08: 4 mg via INTRAVENOUS
  Filled 2019-09-08: qty 1

## 2019-09-08 MED ORDER — BACLOFEN 10 MG PO TABS: 10.0000 mg | ORAL_TABLET | Freq: Three times a day (TID) | ORAL | 1 refills | Status: AC

## 2019-09-08 MED ORDER — TRAMADOL HCL 50 MG PO TABS: 50.0000 mg | ORAL_TABLET | Freq: Four times a day (QID) | ORAL | 0 refills | Status: DC | PRN
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
## 2019-09-08 NOTE — Discharge Instructions (Signed)
Follow-up with either an urgent care of your company's choice if continued chest soreness or abdominal pain.  Or return to the emergency department if you feel that you are worsening.  If you have musculoskeletal pain such as neck pain back pain hand pain then follow-up with orthopedics. Take medication as prescribed.  Apply ice to all areas that hurt.  You will be sore for at least a week.  This is normal.
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
## 2022-11-19 ENCOUNTER — Encounter: Payer: Self-pay | Admitting: Internal Medicine

## 2022-11-19 ENCOUNTER — Ambulatory Visit (INDEPENDENT_AMBULATORY_CARE_PROVIDER_SITE_OTHER): Payer: Managed Care, Other (non HMO) | Admitting: Internal Medicine

## 2022-11-19 VITALS — BP 122/76 | HR 76 | Temp 97.2°F | Ht 73.0 in | Wt 285.0 lb

## 2022-11-19 DIAGNOSIS — T781XXA Other adverse food reactions, not elsewhere classified, initial encounter: Secondary | ICD-10-CM

## 2022-11-19 DIAGNOSIS — T782XXA Anaphylactic shock, unspecified, initial encounter: Secondary | ICD-10-CM

## 2022-11-19 NOTE — Patient Instructions (Addendum)
Food allergy:  - today's skin testing was negative to strawberry  - please strictly avoid strawberries for now, will get blood work to see if you may have outgrown this allergy  - for SKIN only reaction, okay to take Benadryl 50  mg  every 6 hours - for SKIN + ANY additional symptoms, OR IF concern for LIFE THREATENING reaction = Epipen Autoinjector EpiPen 0.3 mg. - If using Epinephrine autoinjector, call 911 - A food allergy action plan has been provided and discussed. - Medic Alert identification is recommended.    Drug Allergies: Legrand Pitts, Lisinopril: Avoid for now  Common ingredients to avoid between Lisinopril and Farxiga (yellow ferric oxide. magnesium stearate) , but no common ingredient for Mounjaro  Will get labs to screen for mast cell disorders  Ask PCP/Endocrinologist about next diabetic medicine they would like to add we can consider drug testing to this medication prior to starting We will look at possible desensitization regimens if one of the above medicines is absolutely indicated  Follow up: we will call you with blood work and next steps   Thank you so much for letting me partake in your care today.  Don't hesitate to reach out if you have any additional concerns!  Ferol Luz, MD  [facility] [place] [place]

## 2022-11-19 NOTE — Progress Notes (Signed)
New Patient Note  RE: Hayden Burke MRN: [id number] DOB: [birth year] Date of Office Visit: 11/19/2022  Consult requested by: Lonie Peak, PA-C Primary care provider: Lonie Peak, PA-C  Chief Complaint: Allergic Reaction  History of Present Illness: I had the pleasure of seeing Hayden Burke for initial evaluation at the [facility] on 11/19/2022. He is a 47 y.o. male, who is referred here by Lonie Peak, PA-C for the evaluation of drug allergies, food allergies .  History obtained from patient, chart review.  Drug Allergy:  Farxiga - diarrhea, vomiting, urticaria  ED visit 07/24/22:  On exam patient is slightly anxious. He has a Mallampati 3. No obvious oropharyngeal edema. No stridor. No access secretions. Lungs sound clear without wheezing. Abdomen is soft and nontender. He does have urticaria on his bilateral upper extremities.  Given subjective oropharyngeal swelling as well as nausea, vomiting, and hives patient does meet criteria for anaphylaxis. No obvious triggers except possibly some new noodles that he had last night. Thankfully he is hemodynamically stable and maintaining his airway at this time. Will give epi 0.5 mg IM as well as Pepcid 40 mg IV, methylprednisolone 125 mg IV and Zofran 4 mg IV. Will reassess shortly after epi for symptom improvement.  ED Course as of 07/24/22 1954  Tue Jul 24, 2022  0800 I reassessed the patient about 20 minutes after epi and he is having significant improvement in his breathing. Still has some hives but overall feeling much better. Feels his voice is less muffled and he is breathing normally and comfortably. This is very reassuring. Will continue to monitor for 4 hours after epi administration  1115 Patient hives have nearly completely resolved and respiratory symptoms have completely resolved for the rest of his stay after receiving epi. Will discharge with EpiPen refill and return precautions. Recommended Zyrtec  twice daily if hives return. Patient agreed to plan. Patient discharged in stable condition. "  Mounjaro -after first injection he developed nausea, second injection caused diffuse pruritus, indigestion, 3rd injection - throat swelling, hives, nausea/vomiting.    Lisinopril - urticaria, throat swelling, hives   Concern for Food Allergy:  Food of concern: strawberry History of reaction: throat swelling, urticaria,  Previous allergy testing no Eats egg, dairy, wheat, soy, fish, shellfish, peanuts, tree nuts, sesame without reactions Carries an epinephrine autoinjector: yes Has food allergy action plan no  Also reports recurrent vomiting and diarrhea occurring every 2 to 3 weeks.  Symptoms started every time he gets placed on a second diabetic medication.   Per PI from FDA:  Marcelline Deist ingredients: : microcrystalline cellulose, anhydrous lactose,  15, crospovidone, silicon dioxide, and magnesium stearate. In addition, the film coating contains the following inactive ingredients: polyvinyl alcohol, titanium dioxide, polyethylene glycol, talc, and yellow iron oxide  Mounjaro ingredients:  sodium chloride, sodium phosphate dibasic heptahydrate, and water for injection. Hydrochloric acid solution and/or sodium hydroxide solution may have been added to adjust the pH.  Lisinopril ingredients: 2.5 mg tablets - calcium phosphate, magnesium stearate, mannitol, starch. 5, 10, 20 and 30 mg tablets - calcium phosphate, magnesium stearate, mannitol, red ferric oxide, starch.  40 mg tablets - calcium phosphate, magnesium stearate, mannitol, starch, yellow ferric oxide.    Assessment and Plan: Hayden Burke is a 47 y.o. male with: Anaphylaxis, initial encounter - Plan: Tryptase  Adverse food reaction, initial encounter - Plan: Allergy Test, Allergen, Strawberry, f44   Plan: Patient Instructions  Food allergy:  - today's skin testing was negative to  strawberry  - please strictly avoid strawberries for now,  will get blood work to see if you may have outgrown this allergy  - for SKIN only reaction, okay to take Benadryl 50  mg  every 6 hours - for SKIN + ANY additional symptoms, OR IF concern for LIFE THREATENING reaction = Epipen Autoinjector EpiPen 0.3 mg. - If using Epinephrine autoinjector, call 911 - A food allergy action plan has been provided and discussed. - Medic Alert identification is recommended.    Drug Allergies: Legrand Pitts, Lisinopril: Avoid for now  Common ingredients to avoid between Lisinopril and Farxiga (yellow ferric oxide. magnesium stearate) , but no common ingredient for Mounjaro  Will get labs to screen for mast cell disorders  Ask PCP/Endocrinologist about next diabetic medicine they would like to add we can consider drug testing to this medication prior to starting We will look at possible desensitization regimens if one of the above medicines is absolutely indicated  Follow up: we will call you with blood work and next steps   Thank you so much for letting me partake in your care today.  Don't hesitate to reach out if you have any additional concerns!  Ferol Luz, MD  [facility] [place] [place]    No orders of the defined types were placed in this encounter.  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  adequate controls  Results interpreted by myself and discussed with patient/family.  Food Adult Perc - 11/19/22 0900     Time Antigen Placed 3086    Allergen Manufacturer Waynette Buttery    Location Back    Number of allergen test 3     Control-buffer 50% Glycerol Negative    Control-Histamine 4+    60. Strawberry Negative             Past  Medical History: Patient Active Problem List   Diagnosis Date Noted   Post-tonsillectomy hemorrhage 08/25/2017   Past Medical History:  Diagnosis Date   Diabetes mellitus    Hypertension    Past Surgical History: Past Surgical History:  Procedure Laterality Date   ANTERIOR CRUCIATE LIGAMENT REPAIR     TONSILLECTOMY N/A 08/25/2017   Procedure: CONTROL OF POST TONSILLAR BLEED;  Surgeon: Osborn Coho, MD;  [facility];  Service: ENT;  Laterality: N/A;   Medication List:  Current Outpatient Medications  Medication Sig Dispense Refill   atorvastatin (LIPITOR) 40 MG tablet Take 40 mg by mouth daily.     EPINEPHrine (EPIPEN) 0.3 mg/0.3 mL DEVI Inject 0.3 mLs (0.3 mg total) into the muscle as needed. 2 Device 0   escitalopram (LEXAPRO) 10 MG tablet Take 10 mg by mouth daily.     metoprolol succinate (TOPROL-XL) 25 MG 24 hr tablet Take 25 mg by mouth every morning.     pioglitazone (ACTOS) 45 MG tablet Take 45 mg by mouth daily.     No current facility-administered medications for this visit.   Allergies: Allergies  Allergen Reactions   Farxiga [Dapagliflozin] Anaphylaxis   Food Anaphylaxis    Strawberries seed    Lisinopril Anaphylaxis   Mounjaro [Tirzepatide] Anaphylaxis   Ace Inhibitors    Erythromycin Hives  and Nausea And Vomiting   Sulfa Antibiotics Nausea And Vomiting   Social History: Social History   Socioeconomic History   Marital status: Single    Spouse name: Not on file   Number of children: Not on file   Years of education: Not on file   Highest education level: Not on file  Occupational History   Not on file  Tobacco Use   Smoking status: Never   Smokeless tobacco: Never  Substance and Sexual Activity   Alcohol use: Yes   Drug use: Not Currently   Sexual activity: Not on file  Other Topics Concern   Not on file  Social History Narrative   Not on file   Social Determinants of Health   Financial Resource Strain: Not on file  Food Insecurity:  Not on file  Transportation Needs: Not on file  Physical Activity: Not on file  Stress: Not on file  Social Connections: Not on file   Lives in a single-family home built in [place].  No roaches in the house and bed is to get off the floor.  No dust mite precautions.  Not exposed to fumes, chemicals or dust.  No HEPA filter in the home and home [place] not near an [place] industrial area. Smoking: No exposure Occupation: Works as a Immunologist History: Immunologist in the house: no Engineer, civil (consulting) in the family room: no Carpet in the bedroom: no Heating: electric Cooling: central Pet: yes 2 dogs and 1 cat with access to bedroom  Family History: History reviewed. No pertinent family history.   ROS: All others negative except as noted per HPI.   Objective: BP 122/76   Pulse 76   Temp (!) 97.2 F (36.2 C) (Temporal)   Ht 6\' 1"  (1.854 m)   Wt 285 lb (129.3 kg)   SpO2 98%   BMI 37.60 kg/m  Body mass index is 37.6 kg/m.  General Appearance:  Alert, cooperative, no distress, appears stated age  Head:  Normocephalic, without obvious abnormality, atraumatic  Eyes:  Conjunctiva clear, EOM's intact  Nose: Nares normal, normal mucosa, no visible anterior polyps, and septum midline  Throat: Lips, tongue normal; teeth and gums normal, normal posterior oropharynx  Neck: Supple, symmetrical  Lungs:   clear to auscultation bilaterally, Respirations unlabored, no coughing  Heart:  regular rate and rhythm and no murmur, Appears well perfused  Extremities: No edema  Skin: Skin color, texture, turgor normal, no rashes or lesions on visualized portions of skin  Neurologic: No gross deficits   The plan was reviewed with the patient/family, and all questions/concerned were addressed.  It was my pleasure to see Hayden Burke today and participate in his care. Please feel free to contact me with any questions or concerns.  Sincerely,  Ferol Luz, MD Allergy &  Immunology  [facility] office: [phone] [facility] office: [phone]

## 2022-11-21 LAB — ALLERGEN, STRAWBERRY, F44: Allergen Strawberry IgE: <0.1 kU/L

## 2022-11-21 LAB — TRYPTASE: Tryptase: 3.6 ug/L (ref 2.2–13.2)

## 2022-11-21 NOTE — Progress Notes (Signed)
Marker for over active allergy cells (tryptase) was normal.  Strawberry was negative.  We could consider a strawberry food challenge to further evaluate the persistence of this allergy

## 2022-11-22 NOTE — Patient Instructions (Addendum)
Hayden Burke was able to tolerate the strawberry food challenge today at the office without adverse signs or symptoms of an allergic reaction. Therefore, he has the same risk of systemic reaction associated with the consumption of strawberry products as the general population.  - Do not give any strawberry products for the next 24 hours. - Monitor for allergic symptoms such as rash, wheezing, diarrhea, swelling, and vomiting for the next 24 hours. If severe symptoms occur, treat with EpiPen injection and call 911. For less severe symptoms treat with Benadryl 4 teaspoonfuls every 6 hours and call the clinic.  - If no allergic symptoms are evident, reintroduce strawberry products into the diet, 1-2 servings a day. If he develops an allergic reaction to strawberry products, record what was eaten, the amount eaten, preparation method, time from ingestion to reaction, and symptoms.    Drug Allergies: Legrand Pitts, Lisinopril: Avoid for now  Common ingredients to avoid between Lisinopril and Farxiga (yellow ferric oxide. magnesium stearate) , but no common ingredient for Mounjaro  Tryptase normal on 11/19/22 Ask PCP/Endocrinologist about next diabetic medicine they would like to add we can consider drug testing to this medication prior to starting We will look at possible desensitization regimens if one of the above medicines is absolutely indicated

## 2022-11-23 ENCOUNTER — Encounter: Payer: Self-pay | Admitting: Family

## 2022-11-23 ENCOUNTER — Ambulatory Visit (INDEPENDENT_AMBULATORY_CARE_PROVIDER_SITE_OTHER): Payer: Managed Care, Other (non HMO) | Admitting: Family

## 2022-11-23 VITALS — BP 122/76 | HR 65 | Temp 98.1°F | Resp 16

## 2022-11-23 DIAGNOSIS — T781XXD Other adverse food reactions, not elsewhere classified, subsequent encounter: Secondary | ICD-10-CM | POA: Diagnosis not present

## 2022-11-23 DIAGNOSIS — T782XXD Anaphylactic shock, unspecified, subsequent encounter: Secondary | ICD-10-CM

## 2022-11-23 NOTE — Addendum Note (Signed)
Addended by: Florence Canner on: 11/23/2022 03:10 PM   Modules accepted: Orders

## 2022-11-23 NOTE — Addendum Note (Signed)
Addended by: Florence Canner on: 11/23/2022 03:01 PM   Modules accepted: Orders

## 2022-11-23 NOTE — Progress Notes (Signed)
[address] [place] [address] Dept: [phone]  FOLLOW UP NOTE  Patient ID: Hayden Burke, male    DOB: February 04, 1976  Age: 47 y.o. MRN: [id number] Date of Office Visit: 11/23/2022  Assessment  Chief Complaint: Food/Drug Challenge Hayden Burke)  HPI Hayden Burke is a 47 year old male who presents today for an oral food challenge to strawberry.  He was last seen on November 19, 2022 by Dr. Marlynn Perking for anaphylaxis and adverse food reaction.  He denies any new diagnosis or surgery since his last office visit.  He reports that when he was around 89 to 47 years old he had throat swelling and urticaria after eating strawberries.  He reports that he has avoided strawberry since then.  He has been off all antihistamines for the past 3 days and is in good health today.  He denies any cardiorespiratory, gastrointestinal, and cutaneous symptoms.  All questions answered and informed consent signed.   Drug Allergies:  Allergies  Allergen Reactions   Farxiga [Dapagliflozin] Anaphylaxis   Food Anaphylaxis    Strawberries seed    Lisinopril Anaphylaxis   Mounjaro [Tirzepatide] Anaphylaxis   Ace Inhibitors    Erythromycin Hives and Nausea And Vomiting   Sulfa Antibiotics Nausea And Vomiting    Review of Systems: Negative except as per HPI  Physical Exam: BP 122/76 (BP Location: Right Arm, Patient Position: Sitting, Cuff Size: Large)   Pulse 65   Temp 98.1 F (36.7 C) (Temporal)   Resp 16   SpO2 99%    Physical Exam Constitutional:      Appearance: Normal appearance.  HENT:     Head: Normocephalic and atraumatic.     Comments: Pharynx normal, eyes normal, ears normal, nose normal    Right Ear: Tympanic membrane, ear canal and external ear normal.     Left Ear: Tympanic membrane, ear canal and external ear normal.     Nose: Nose normal.     Mouth/Throat:     Mouth: Mucous membranes are moist.     Pharynx: Oropharynx is clear.  Eyes:     Conjunctiva/sclera: Conjunctivae  normal.  Cardiovascular:     Rate and Rhythm: Regular rhythm.     Heart sounds: Normal heart sounds.  Pulmonary:     Effort: Pulmonary effort is normal.     Breath sounds: Normal breath sounds.     Comments: Lungs clear to auscultation Musculoskeletal:     Cervical back: Neck supple.  Skin:    General: Skin is warm.     Comments: No rashes or urticarial lesions noted  Neurological:     Mental Status: He is alert and oriented to person, place, and time.  Psychiatric:        Mood and Affect: Mood normal.        Behavior: Behavior normal.        Thought Content: Thought content normal.        Judgment: Judgment normal.     Diagnostics:  Open graded strawberry oral challenge: The patient was able to tolerate the challenge today without adverse signs or symptoms. Vital signs were stable throughout the challenge and observation period. He received multiple doses separated by 15 minutes, each of which was separated by vitals and a brief physical exam.Total goal: 1 cup of strawberries He received the following doses: lip rub, 1/4 strawberry, 1/2 strawberry,  1-1/2 strawberries, 2 strawberries, and 3 strawberries. He was monitored for 60 minutes following the last dose.   The patient had negative  skin prick test and sIgE tests to strawberry  and was able to tolerate the open graded oral challenge today without adverse signs or symptoms. Therefore, he has the same risk of systemic reaction associated with the consumption of strawberry  as the general population.   Assessment and Plan: 1. Adverse food reaction, subsequent encounter   2. Anaphylaxis, subsequent encounter     No orders of the defined types were placed in this encounter.   Patient Instructions  Hayden Burke was able to tolerate the strawberry food challenge today at the office without adverse signs or symptoms of an allergic reaction. Therefore, he has the same risk of systemic reaction associated with the consumption of strawberry  products as the general population.  - Do not give any strawberry products for the next 24 hours. - Monitor for allergic symptoms such as rash, wheezing, diarrhea, swelling, and vomiting for the next 24 hours. If severe symptoms occur, treat with EpiPen injection and call 911. For less severe symptoms treat with Benadryl 4 teaspoonfuls every 6 hours and call the clinic.  - If no allergic symptoms are evident, reintroduce strawberry products into the diet, 1-2 servings a day. If he develops an allergic reaction to strawberry products, record what was eaten, the amount eaten, preparation method, time from ingestion to reaction, and symptoms.    Drug Allergies: Hayden Burke, Lisinopril: Avoid for now  Common ingredients to avoid between Lisinopril and Farxiga (yellow ferric oxide. magnesium stearate) , but no common ingredient for Mounjaro  Tryptase normal on 11/19/22 Ask PCP/Endocrinologist about next diabetic medicine they would like to add we can consider drug testing to this medication prior to starting We will look at possible desensitization regimens if one of the above medicines is absolutely indicated  No follow-ups on file.    Thank you for the opportunity to care for this patient.  Please do not hesitate to contact me with questions.  Hayden Settle, FNP [facility]

## 2023-03-26 ENCOUNTER — Telehealth: Payer: Self-pay | Admitting: Family

## 2023-03-26 NOTE — Telephone Encounter (Signed)
 It looks like from Dr. Alfredia Ferguson note on 11/19/22 that she wanted to possibly do drug testing to his new diabetic medication before starting. He previously had a reaction to [facility]. Is it possible to do testing to glipizide?

## 2023-03-26 NOTE — Telephone Encounter (Signed)
 Glipizide only comes in tablet formation.  I'd recommend 10/90 drug challenge as next step prior to starting it.

## 2023-03-26 NOTE — Telephone Encounter (Signed)
 Please see Dr. Arvis recommendation for a 10/90 drug challenge before starting the glipizide. He would need to be off all antihistamines 3 days prior to this appointment and in good health. ( No recent vaccines or antibiotics in the past 7 days. He will need to bring the glipizide with him the day of the challenge. This appointment will last around 2-3 hours.

## 2023-03-26 NOTE — Telephone Encounter (Signed)
 Patient called stating he was told to call if his diabetic medication changed patient advised will be taking Glipizide as of 03-26-2023

## 2023-03-27 NOTE — Telephone Encounter (Signed)
Lm for pt to call us back about this °

## 2023-03-27 NOTE — Telephone Encounter (Signed)
 Pt informed and scheduled for a challenge on Friday

## 2023-03-29 ENCOUNTER — Encounter: Payer: Self-pay | Admitting: Family

## 2023-03-29 ENCOUNTER — Telehealth: Payer: Self-pay | Admitting: Family

## 2023-03-29 ENCOUNTER — Ambulatory Visit (INDEPENDENT_AMBULATORY_CARE_PROVIDER_SITE_OTHER): Payer: Managed Care, Other (non HMO) | Admitting: Family

## 2023-03-29 VITALS — BP 126/78 | HR 78 | Temp 98.0°F | Resp 18 | Wt 300.9 lb

## 2023-03-29 DIAGNOSIS — R112 Nausea with vomiting, unspecified: Secondary | ICD-10-CM

## 2023-03-29 DIAGNOSIS — R197 Diarrhea, unspecified: Secondary | ICD-10-CM

## 2023-03-29 NOTE — Progress Notes (Signed)
[address] [place] [address] Dept: [phone]  FOLLOW UP NOTE  Patient ID: Hayden Burke, male    DOB: 09-Feb-1976  Age: 48 y.o. MRN: [id number] Date of Office Visit: 03/29/2023  Assessment  Chief Complaint: Food/Drug Challenge (Glipside challenge)  HPI Hayden Burke is a 48 year old male who presents today for an oral challenge to glipizide 10 mg.  He was last seen by myself on November 23, 2022 where he passed the strawberry in office oral challenge.  Prior to that he saw Dr. Marlynn Perking on November 19, 2022 for anaphylaxis initial encounter, and adverse food reaction.  He denies any new diagnosis or surgery since his last office visit.  He reports that he has been off all antihistamines for the past 3 days and is good health.  He denies any cardiorespiratory, gastrointestinal, and cutaneous symptoms.  He wonders if he should even come in today and do the challenge to glipizide 10 mg, because with previous medications it always takes a slow buildup of the medications in his system before anything happens.  Nothing ever happens immediately.  It usually takes about 1-1/2 to 2 weeks for he starts having more burping, diarrhea, and throwing up.  He reports that his endocrinologist is wanting him to try Ozempic due to the potential to lose weight also.  Would like to know Dr. Alfredia Ferguson thoughts.  All questions answered and informed consent signed.  His office visit from November 19, 2022 with Dr. Marlynn Perking show:  "Drug Allergy:  Hayden Burke - diarrhea, vomiting, urticaria  ED visit 07/24/22:  On exam patient is slightly anxious. He has a Mallampati 3. No obvious oropharyngeal edema. No stridor. No access secretions. Lungs sound clear without wheezing. Abdomen is soft and nontender. He does have urticaria on his bilateral upper extremities.  Given subjective oropharyngeal swelling as well as nausea, vomiting, and hives patient does meet criteria for anaphylaxis. No obvious triggers except  possibly some new noodles that he had last night. Thankfully he is hemodynamically stable and maintaining his airway at this time. Will give epi 0.5 mg IM as well as Pepcid 40 mg IV, methylprednisolone 125 mg IV and Zofran 4 mg IV. Will reassess shortly after epi for symptom improvement.  ED Course as of 07/24/22 1954  Tue Jul 24, 2022  0800 I reassessed the patient about 20 minutes after epi and he is having significant improvement in his breathing. Still has some hives but overall feeling much better. Feels his voice is less muffled and he is breathing normally and comfortably. This is very reassuring. Will continue to monitor for 4 hours after epi administration  1115 Patient hives have nearly completely resolved and respiratory symptoms have completely resolved for the rest of his stay after receiving epi. Will discharge with EpiPen refill and return precautions. Recommended Zyrtec twice daily if hives return. Patient agreed to plan. Patient discharged in stable condition. "   Mounjaro -after first injection he developed nausea, second injection caused diffuse pruritus, indigestion, 3rd injection - throat swelling, hives, nausea/vomiting.    Lisinopril - urticaria, throat swelling, hives  Also reports recurrent vomiting and diarrhea occurring every 2 to 3 weeks.  Symptoms started every time he gets placed on a second diabetic medication.    Per PI from FDA:  Hayden Burke ingredients: : microcrystalline cellulose, anhydrous lactose,  15, crospovidone, silicon dioxide, and magnesium stearate. In addition, the film coating contains the following inactive ingredients: polyvinyl alcohol, titanium dioxide, polyethylene glycol, talc, and yellow iron oxide  Mounjaro ingredients:  sodium chloride, sodium phosphate dibasic heptahydrate, and water for injection. Hydrochloric acid solution and/or sodium hydroxide solution may have been added to adjust the pH.   Lisinopril ingredients: 2.5 mg tablets - calcium  phosphate, magnesium stearate, mannitol, starch. 5, 10, 20 and 30 mg tablets - calcium phosphate, magnesium stearate, mannitol, red ferric oxide, starch.  40 mg tablets - calcium phosphate, magnesium stearate, mannitol, starch, yellow ferric oxide. "   What are the ingredients in Ozempic? Medicinal ingredients: semaglutide. One mL solution for injection contains 0.68 mg, 1.34 mg or  2.68 mg semaglutide.  Non-medicinal ingredients: disodium phosphate dihydrate, propylene glycol, phenol, and water  for injections.       Drug Allergies:  Allergies  Allergen Reactions   Farxiga [Dapagliflozin] Anaphylaxis   Lisinopril Anaphylaxis   Mounjaro [Tirzepatide] Anaphylaxis   Ace Inhibitors    Erythromycin Hives and Nausea And Vomiting   Sulfa Antibiotics Nausea And Vomiting    Review of Systems: Negative except as per HPI   Physical Exam: BP 126/78   Pulse 78   Temp 98 F (36.7 C) (Temporal)   Resp 18   Wt (!) 300 lb 14.4 oz (136.5 kg)   SpO2 98%   BMI 39.70 kg/m    Physical Exam Constitutional:      Appearance: Normal appearance.  HENT:     Head: Normocephalic and atraumatic.     Comments: Pharynx normal. Eyes normal. Ears normal. Nose normal    Right Ear: Tympanic membrane, ear canal and external ear normal.     Left Ear: Tympanic membrane, ear canal and external ear normal.     Nose: Nose normal.     Mouth/Throat:     Mouth: Mucous membranes are moist.     Pharynx: Oropharynx is clear.  Eyes:     Conjunctiva/sclera: Conjunctivae normal.  Cardiovascular:     Rate and Rhythm: Regular rhythm.     Heart sounds: Normal heart sounds.  Pulmonary:     Effort: Pulmonary effort is normal.     Breath sounds: Normal breath sounds.     Comments: Lungs clear to auscultation Musculoskeletal:     Cervical back: Neck supple.  Skin:    General: Skin is warm.  Neurological:     Mental Status: He is alert and oriented to person, place, and time.  Psychiatric:        Mood and  Affect: Mood normal.        Behavior: Behavior normal.        Thought Content: Thought content normal.        Judgment: Judgment normal.     Diagnostics:  Open graded glipizide 10 mg oral challenge: The patient was able to tolerate the challenge today without adverse signs or symptoms. Vital signs were stable throughout the challenge and observation period. He received multiple doses separated by 30 minutes, each of which was separated by vitals and a brief physical exam. He received the following doses: 10%tablet, 90% tablet. He was monitored for 60 minutes following the last dose.   The patient was able to tolerate the open graded oral challenge today without adverse signs or symptoms. Therefore, he has the same risk of systemic reaction associated with the consumption of glipizide  as the general population.   Assessment and Plan: 1. Diarrhea, unspecified type   2. Nausea and vomiting, unspecified vomiting type     No orders of the defined types were placed in this encounter.   Patient Instructions  Hayden Burke was able to tolerate the glipizide 10 mg challenge today at the office without adverse signs or symptoms of an allergic reaction. Therefore, he has the same risk of systemic reaction associated with the consumption of glipizide products as the general population.  - Do not give any glipizide products for the next 24 hours. - Monitor for allergic symptoms such as rash, wheezing, diarrhea, swelling, and vomiting for the next 24 hours. If severe symptoms occur, treat with EpiPen injection and call 911. For less severe symptoms treat with Benadryl 4 teaspoonfuls every 4-6 hours and call the clinic.  - If no allergic symptoms are evident, reintroduce glipizide 10 mg as prescribed by your endocrinologist. - I will speak with Dr. Marlynn Perking about your endocrinologist wanting to start Ozempic. We will be in touch with you. ( Reactions with Mounjaro: -after first injection he developed nausea,  second injection caused diffuse pruritus, indigestion, 3rd injection - throat swelling, hives, nausea/vomiting.)    No follow-ups on file.    Thank you for the opportunity to care for this patient.  Please do not hesitate to contact me with questions.  Nehemiah Settle, FNP [facility]

## 2023-03-29 NOTE — Telephone Encounter (Signed)
Hayden Burke reports that his endocrinologist would like for him to try Ozempic, but would like your thoughts first due to his reaction with Mounjaro. Reactions with Mounjaro: -after first injection he developed nausea, second injection caused diffuse pruritus, indigestion, 3rd injection - throat swelling, hives, nausea/vomiting.)   "Per PI from FDA:  [place] ingredients: : microcrystalline cellulose, anhydrous lactose,  15, crospovidone, silicon dioxide, and magnesium stearate. In addition, the film coating contains the following inactive ingredients: polyvinyl alcohol, titanium dioxide, polyethylene glycol, talc, and yellow iron oxide   Mounjaro ingredients:  sodium chloride, sodium phosphate dibasic heptahydrate, and water for injection. Hydrochloric acid solution and/or sodium hydroxide solution may have been added to adjust the pH.   Lisinopril ingredients: 2.5 mg tablets - calcium phosphate, magnesium stearate, mannitol, starch. 5, 10, 20 and 30 mg tablets - calcium phosphate, magnesium stearate, mannitol, red ferric oxide, starch.  40 mg tablets - calcium phosphate, magnesium stearate, mannitol, starch, yellow ferric oxide. "   What are the ingredients in Ozempic? Medicinal ingredients: semaglutide. One mL solution for injection contains 0.68 mg, 1.34 mg or  2.68 mg semaglutide.  Non-medicinal ingredients: disodium phosphate dihydrate, propylene glycol, phenol, and water  for injections.

## 2023-03-29 NOTE — Patient Instructions (Addendum)
Hayden Burke was able to tolerate the glipizide 10 mg challenge today at the office without adverse signs or symptoms of an allergic reaction. Therefore, he has the same risk of systemic reaction associated with the consumption of glipizide products as the general population.  - Do not give any glipizide products for the next 24 hours. - Monitor for allergic symptoms such as rash, wheezing, diarrhea, swelling, and vomiting for the next 24 hours. If severe symptoms occur, treat with EpiPen injection and call 911. For less severe symptoms treat with Benadryl 4 teaspoonfuls every 4-6 hours and call the clinic.  - If no allergic symptoms are evident, reintroduce glipizide 10 mg as prescribed by your endocrinologist. - I will speak with Dr. Marlynn Perking about your endocrinologist wanting to start Ozempic. We will be in touch with you. ( Reactions with Mounjaro: -after first injection he developed nausea, second injection caused diffuse pruritus, indigestion, 3rd injection - throat swelling, hives, nausea/vomiting.)

## 2023-04-02 NOTE — Telephone Encounter (Signed)
Can we get a dose of ozempic to do skin testing to:  we would do 1:1 skin prick, 1:1000, 1:100 and 1:10 for IDs.  We can do it on Monday and I can serve as the "control" for the IDs if we can get the medicine.

## 2023-04-02 NOTE — Telephone Encounter (Signed)
Spoke with pt he will call endo and see about getting the ozempic and call us back to get on schedule for the testing

## 2023-04-02 NOTE — Telephone Encounter (Signed)
Please let Hayden Burke know that we can do skin testing to ozempic. He will need to have his endocrinologist prescribe the medicine and Hayden Burke bring it to our office before the appointment. Please schedule on a Monday on my schedule on a Monday that Dr. Marlynn Perking is here. He will need to be off all antihistamines 3 days prior to this appointment and in good health (no recent vaccines or antibiotics in the past 7 days). This appointment will last a couple hours.

## 2023-06-10 ENCOUNTER — Other Ambulatory Visit: Payer: Self-pay | Admitting: Urology

## 2023-06-19 NOTE — Patient Instructions (Signed)
 DUE TO COVID-19 ONLY TWO VISITORS  (aged 49 and older)  ARE ALLOWED TO COME WITH YOU AND STAY IN THE WAITING ROOM ONLY DURING PRE OP AND PROCEDURE.   **NO VISITORS ARE ALLOWED IN THE SHORT STAY AREA OR RECOVERY ROOM!!**  IF YOU WILL BE ADMITTED INTO THE HOSPITAL YOU ARE ALLOWED ONLY FOUR SUPPORT PEOPLE DURING VISITATION HOURS ONLY (7 AM -8PM)   The support person(s) must pass our screening, gel in and out, and wear a mask at all times, including in the patient's room. Patients must also wear a mask when staff or their support person are in the room. Visitors GUEST BADGE MUST BE WORN VISIBLY  One adult visitor may remain with you overnight and MUST be in the room by 8 P.M.     Your procedure is scheduled on: 07/04/23   Report to [facility] Main Entrance    Report to admitting at : 8:00 AM   Call this number if you have problems the morning of surgery [phone]   Do not eat food or drink: After Midnight.  FOLLOW ANY ADDITIONAL PRE OP INSTRUCTIONS YOU RECEIVED FROM YOUR SURGEON'S OFFICE!!!   Oral Hygiene is also important to reduce your risk of infection.                                    Remember - BRUSH YOUR TEETH THE MORNING OF SURGERY WITH YOUR REGULAR TOOTHPASTE  DENTURES WILL BE REMOVED PRIOR TO SURGERY PLEASE DO NOT APPLY "Poly grip" OR ADHESIVES!!!   Do NOT smoke after Midnight   Take these medicines the morning of surgery with A SIP OF WATER: escitalopram,metoprolol,tamsulosin.Tylenol as needed.  DO NOT TAKE ANY ORAL DIABETIC MEDICATIONS DAY OF YOUR SURGERY                              You may not have any metal on your body including hair pins, jewelry, and body piercing             Do not wear lotions, powders, perfumes/cologne, or deodorant              Men may shave face and neck.   Do not bring valuables to the hospital. [facility] IS NOT             RESPONSIBLE   FOR VALUABLES.   Contacts, glasses, or bridgework may not be worn into  surgery.   Bring small overnight bag day of surgery.   DO NOT BRING YOUR HOME MEDICATIONS TO THE HOSPITAL. PHARMACY WILL DISPENSE MEDICATIONS LISTED ON YOUR MEDICATION LIST TO YOU DURING YOUR ADMISSION IN THE HOSPITAL!    Patients discharged on the day of surgery will not be allowed to drive home.  Someone NEEDS to stay with you for the first 24 hours after anesthesia.   Special Instructions: Bring a copy of your healthcare power of attorney and living will documents         the day of surgery if you haven't scanned them before.              Please read over the following fact sheets you were given: IF YOU HAVE QUESTIONS ABOUT YOUR PRE-OP INSTRUCTIONS PLEASE CALL [phone]    [facility] - Preparing for Surgery Before surgery, you can play an important role.  Because skin is not sterile, your  skin needs to be as free of germs as possible.  You can reduce the number of germs on your skin by washing with CHG (chlorahexidine gluconate) soap before surgery.  CHG is an antiseptic cleaner which kills germs and bonds with the skin to continue killing germs even after washing. Please DO NOT use if you have an allergy to CHG or antibacterial soaps.  If your skin becomes reddened/irritated stop using the CHG and inform your nurse when you arrive at Short Stay. Do not shave (including legs and underarms) for at least 48 hours prior to the first CHG shower.  You may shave your face/neck. Please follow these instructions carefully:  1.  Shower with CHG Soap the night before surgery and the  morning of Surgery.  2.  If you choose to wash your hair, wash your hair first as usual with your  normal  shampoo.  3.  After you shampoo, rinse your hair and body thoroughly to remove the  shampoo.                           4.  Use CHG as you would any other liquid soap.  You can apply chg directly  to the skin and wash                       Gently with a scrungie or clean washcloth.  5.  Apply the CHG Soap to your  body ONLY FROM THE NECK DOWN.   Do not use on face/ open                           Wound or open sores. Avoid contact with eyes, ears mouth and genitals (private parts).                       Wash face,  Genitals (private parts) with your normal soap.             6.  Wash thoroughly, paying special attention to the area where your surgery  will be performed.  7.  Thoroughly rinse your body with warm water from the neck down.  8.  DO NOT shower/wash with your normal soap after using and rinsing off  the CHG Soap.                9.  Pat yourself dry with a clean towel.            10.  Wear clean pajamas.            11.  Place clean sheets on your bed the night of your first shower and do not  sleep with pets. Day of Surgery : Do not apply any lotions/deodorants the morning of surgery.  Please wear clean clothes to the hospital/surgery center.  FAILURE TO FOLLOW THESE INSTRUCTIONS MAY RESULT IN THE CANCELLATION OF YOUR SURGERY PATIENT SIGNATURE_________________________________  NURSE SIGNATURE__________________________________  ________________________________________________________________________

## 2023-06-20 ENCOUNTER — Other Ambulatory Visit: Payer: Self-pay

## 2023-06-20 ENCOUNTER — Encounter (HOSPITAL_COMMUNITY): Payer: Self-pay

## 2023-06-20 ENCOUNTER — Encounter (HOSPITAL_COMMUNITY)
Admission: RE | Admit: 2023-06-20 | Discharge: 2023-06-20 | Disposition: A | Discharge status: Home / Self Care | Payer: PRIVATE HEALTH INSURANCE | Source: Ambulatory Visit | Attending: Urology | Admitting: Urology

## 2023-06-20 VITALS — BP 124/88 | HR 87 | Temp 98.5°F | Ht 73.0 in | Wt 292.0 lb

## 2023-06-20 DIAGNOSIS — I10 Essential (primary) hypertension: Secondary | ICD-10-CM | POA: Diagnosis present

## 2023-06-20 DIAGNOSIS — Z01818 Encounter for other preprocedural examination: Secondary | ICD-10-CM | POA: Insufficient documentation

## 2023-06-20 DIAGNOSIS — E119 Type 2 diabetes mellitus without complications: Secondary | ICD-10-CM | POA: Insufficient documentation

## 2023-06-20 LAB — CBC
HCT: 43.8 % (ref 39.0–52.0)
Hemoglobin: 13.8 g/dL (ref 13.0–17.0)
MCH: 28.2 pg (ref 26.0–34.0)
MCHC: 31.5 g/dL (ref 30.0–36.0)
MCV: 89.4 fL (ref 80.0–100.0)
Platelets: 244 10*3/uL (ref 150–400)
RBC: 4.9 MIL/uL (ref 4.22–5.81)
RDW: 13 % (ref 11.5–15.5)
WBC: 11.7 10*3/uL — ABNORMAL HIGH (ref 4.0–10.5)
nRBC: 0 % (ref 0.0–0.2)

## 2023-06-20 LAB — BASIC METABOLIC PANEL WITH GFR
Anion gap: 8 (ref 5–15)
BUN: 10 mg/dL (ref 6–20)
CO2: 24 mmol/L (ref 22–32)
Calcium: 9.1 mg/dL (ref 8.9–10.3)
Chloride: 107 mmol/L (ref 98–111)
Creatinine, Ser: 0.74 mg/dL (ref 0.61–1.24)
GFR, Estimated: >60 mL/min (ref >60)
Glucose, Bld: 119 mg/dL — ABNORMAL HIGH (ref 70–99)
Potassium: 3.9 mmol/L (ref 3.5–5.1)
Sodium: 139 mmol/L (ref 135–145)

## 2023-06-20 LAB — GLUCOSE, CAPILLARY: Glucose-Capillary: 115 mg/dL — ABNORMAL HIGH (ref 70–99)

## 2023-06-20 NOTE — Progress Notes (Addendum)
 For Anesthesia: PCP - Lonie Peak, PA-C  Cardiologist - N/A  Bowel Prep reminder:  Chest x-ray -  EKG - 06/20/23 Stress Test -  ECHO -  Cardiac Cath -  Pacemaker/ICD device last checked: Pacemaker orders received: Device Rep notified:  Spinal Cord Stimulator:N/A  Sleep Study - Yes CPAP - NO  Fasting Blood Sugar -  Checks Blood Sugar _____ times a day Date and result of last Hgb A1c-  Last dose of GLP1 agonist- N/A GLP1 instructions:   Last dose of SGLT-2 inhibitors- N/A SGLT-2 instructions:   Blood Thinner Instructions: N/A Aspirin Instructions: Last Dose:  Activity level: Can go up a flight of stairs and activities of daily living without stopping and without chest pain and/or shortness of breath   Able to exercise without chest pain and/or shortness of breath  Anesthesia review: Hx: HTN,DIA,OSA(NO CPAP)  Patient denies shortness of breath, fever, cough and chest pain at PAT appointment   Patient verbalized understanding of instructions that were given to them at the PAT appointment. Patient was also instructed that they will need to review over the PAT instructions again at home before surgery.

## 2023-06-21 LAB — HEMOGLOBIN A1C
Hgb A1c MFr Bld: 8.8 % — ABNORMAL HIGH (ref 4.8–5.6)
Mean Plasma Glucose: 206 mg/dL

## 2023-07-04 ENCOUNTER — Ambulatory Visit (HOSPITAL_COMMUNITY): Admission: RE | Admit: 2023-07-04 | Payer: PRIVATE HEALTH INSURANCE | Source: Ambulatory Visit | Admitting: Urology

## 2023-07-04 ENCOUNTER — Encounter (HOSPITAL_COMMUNITY): Admission: RE | Payer: Self-pay | Source: Ambulatory Visit

## 2023-07-04 DIAGNOSIS — E119 Type 2 diabetes mellitus without complications: Secondary | ICD-10-CM

## 2023-07-04 DIAGNOSIS — I10 Essential (primary) hypertension: Secondary | ICD-10-CM

## 2023-07-04 SURGERY — CYSTOSCOPY/URETEROSCOPY/HOLMIUM LASER/STENT PLACEMENT
Anesthesia: General | Laterality: Left

## 2024-02-03 ENCOUNTER — Other Ambulatory Visit: Payer: Self-pay | Admitting: Medical Genetics

## 2024-02-10 ENCOUNTER — Encounter

## 2024-02-10 ENCOUNTER — Telehealth: Payer: Self-pay

## 2024-02-10 NOTE — Telephone Encounter (Signed)
 Patient rescheduled PV for 12/02

## 2024-02-10 NOTE — Telephone Encounter (Signed)
 Patient did not sign into MyChart for Pre Visit Appt.  Will wait to se if he reschedules Pre visit before cancelling colonoscopy

## 2024-02-11 ENCOUNTER — Ambulatory Visit

## 2024-02-11 VITALS — Ht 73.0 in | Wt 281.2 lb

## 2024-02-11 DIAGNOSIS — Z1211 Encounter for screening for malignant neoplasm of colon: Secondary | ICD-10-CM

## 2024-02-11 MED ORDER — NA SULFATE-K SULFATE-MG SULF 17.5-3.13-1.6 GM/177ML PO SOLN: 1.0000 | Freq: Once | ORAL | 0 refills | Status: AC

## 2024-02-11 NOTE — Progress Notes (Signed)
 PCP MD at time of PV: Rankin Dike, PA-C __________________________________________________________________________________________________________________________________________  No egg allergy  known to patient  No soy allergy  known to patient No issues known to pt with past sedation with any surgeries or procedures Patient denies ever being told they had issues or difficulty with intubation  No FH of Malignant Hyperthermia Pt is not on diet pills Pt is not on  home 02  Pt is not on blood thinners  No A fib or A flutter Have any cardiac testing pending--no LOA: independent No Chew or Snuff tobacco __________________________________________________________________________________________________________________________________________  Constipation: no Prep: suprep __________________________________________________________________________________________________________________________________________  PV completed with patient. Prep instructions reviewed and provided during apt. Rx sent to preferred pharmacy.

## 2024-02-20 ENCOUNTER — Encounter: Payer: Self-pay | Admitting: Gastroenterology

## 2024-02-24 ENCOUNTER — Ambulatory Visit: Admitting: Gastroenterology

## 2024-02-24 ENCOUNTER — Encounter: Payer: Self-pay | Admitting: Gastroenterology

## 2024-02-24 VITALS — BP 119/86 | HR 64 | Temp 98.2°F | Resp 13 | Ht 73.0 in | Wt 281.2 lb

## 2024-02-24 DIAGNOSIS — Z1211 Encounter for screening for malignant neoplasm of colon: Secondary | ICD-10-CM

## 2024-02-24 DIAGNOSIS — D120 Benign neoplasm of cecum: Secondary | ICD-10-CM

## 2024-02-24 MED ORDER — SODIUM CHLORIDE 0.9 % IV SOLN: 500.0000 mL | INTRAVENOUS | Status: DC

## 2024-02-24 NOTE — Op Note (Signed)
 [facility] Patient Name: Hayden Burke Procedure Date: 02/24/2024 2:37 PM MRN: [id number] Endoscopist: Sandor Flatter , MD, [id number] Age: 48 Referring MD:  Date of Birth: 06-25-1975 Gender: Male Account #: [id number] Procedure:                Colonoscopy Indications:              Screening for colorectal malignant neoplasm Medicines:                Monitored Anesthesia Care Procedure:                Pre-Anesthesia Assessment:                           - Prior to the procedure, a History and Physical                            was performed, and patient medications and                            allergies were reviewed. The patient's tolerance of                            previous anesthesia was also reviewed. The risks                            and benefits of the procedure and the sedation                            options and risks were discussed with the patient.                            All questions were answered, and informed consent                            was obtained. Prior Anticoagulants: The patient has                            taken no anticoagulant or antiplatelet agents. ASA                            Grade Assessment: II - A patient with mild systemic                            disease. After reviewing the risks and benefits,                            the patient was deemed in satisfactory condition to                            undergo the procedure.                           After obtaining informed consent, the colonoscope  was passed under direct vision. Throughout the                            procedure, the patient's blood pressure, pulse, and                            oxygen saturations were monitored continuously. The                            Olympus Scope SN [id number] was introduced through the                            anus and advanced to the the terminal ileum. The                            terminal  ileum, ileocecal valve, appendiceal                            orifice, and rectum were photographed. Scope In: 2:57:43 PM Scope Out: 3:10:02 PM Scope Withdrawal Time: 0 hours 10 minutes 22 seconds  Total Procedure Duration: 0 hours 12 minutes 19 seconds  Findings:                 The perianal and digital rectal examinations were                            normal.                           A 4 mm polyp was found in the cecum. The polyp was                            sessile. The polyp was removed with a cold snare.                            Resection and retrieval were complete. Estimated                            blood loss was minimal.                           There was evidence of a prior end-to-end                            colo-colonic anastomosis in the sigmoid colon. This                            was patent and was characterized by healthy                            appearing mucosa. The anastomosis was traversed.                           The exam was otherwise normal throughout the  remainder of the colon.                           The retroflexed view of the distal rectum and anal                            verge was normal and showed no anal or rectal                            abnormalities.                           The terminal ileum appeared normal. Complications:            No immediate complications. Estimated Blood Loss:     Estimated blood loss was minimal. Impression:               - One 4 mm polyp in the cecum, removed with a cold                            snare. Resected and retrieved.                           - Patent end-to-end colo-colonic anastomosis,                            characterized by healthy appearing mucosa.                           - The distal rectum and anal verge are normal on                            retroflexion view.                           - The examined portion of the ileum was normal. Recommendation:            - Patient has a contact number available for                            emergencies. The signs and symptoms of potential                            delayed complications were discussed with the                            patient. Return to normal activities tomorrow.                            Written discharge instructions were provided to the                            patient.                           - Resume previous diet.                           -  Continue present medications.                           - Await pathology results.                           - Repeat colonoscopy for surveillance based on                            pathology results.                           - Return to GI office PRN. Sandor Flatter, MD 02/24/2024 3:14:57 PM
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
## 2024-02-24 NOTE — Progress Notes (Signed)
 Called to room to assist during endoscopic procedure.  Patient ID and intended procedure confirmed with present staff. Received instructions for my participation in the procedure from the performing physician.

## 2024-02-24 NOTE — Patient Instructions (Signed)
-  Handout on polyp provided. -await pathology results. -repeat colonoscopy for surveillance recommended. Date to be determined when pathology result become available.  -Continue present medications.  YOU HAD AN ENDOSCOPIC PROCEDURE TODAY AT THE [facility]:   Refer to the procedure report that was given to you for any specific questions about what was found during the examination.  If the procedure report does not answer your questions, please call your gastroenterologist to clarify.  If you requested that your care partner not be given the details of your procedure findings, then the procedure report has been included in a sealed envelope for you to review at your convenience later.  YOU SHOULD EXPECT: Some feelings of bloating in the abdomen. Passage of more gas than usual.  Walking can help get rid of the air that was put into your GI tract during the procedure and reduce the bloating. If you had a lower endoscopy (such as a colonoscopy or flexible sigmoidoscopy) you may notice spotting of blood in your stool or on the toilet paper. If you underwent a bowel prep for your procedure, you may not have a normal bowel movement for a few days.  Please Note:  You might notice some irritation and congestion in your nose or some drainage.  This is from the oxygen used during your procedure.  There is no need for concern and it should clear up in a day or so.  SYMPTOMS TO REPORT IMMEDIATELY:  Following lower endoscopy (colonoscopy or flexible sigmoidoscopy):  Excessive amounts of blood in the stool  Significant tenderness or worsening of abdominal pains  Swelling of the abdomen that is new, acute  Fever of 100F or higher  For urgent or emergent issues, a gastroenterologist can be reached at any hour by calling [phone]. Do not use MyChart messaging for urgent concerns.    DIET:  We do recommend a small meal at first, but then you may proceed to your regular diet.  Drink plenty of  fluids but you should avoid alcoholic beverages for 24 hours.  ACTIVITY:  You should plan to take it easy for the rest of today and you should NOT DRIVE or use heavy machinery until tomorrow (because of the sedation medicines used during the test).    FOLLOW UP: Our staff will call the number listed on your records the next business day following your procedure.  We will call around 7:15- 8:00 am to check on you and address any questions or concerns that you may have regarding the information given to you following your procedure. If we do not reach you, we will leave a message.     If any biopsies were taken you will be contacted by phone or by letter within the next 1-3 weeks.  Please call us  at [phone] if you have not heard about the biopsies in 3 weeks.    SIGNATURES/CONFIDENTIALITY: You and/or your care partner have signed paperwork which will be entered into your electronic medical record.  These signatures attest to the fact that that the information above on your After Visit Summary has been reviewed and is understood.  Full responsibility of the confidentiality of this discharge information lies with you and/or your care-partner.

## 2024-02-24 NOTE — Progress Notes (Signed)
 Vss nad trans to pacu

## 2024-02-24 NOTE — Progress Notes (Signed)
 Pt's states no medical or surgical changes since previsit or office visit.

## 2024-02-25 ENCOUNTER — Telehealth: Payer: Self-pay

## 2024-02-25 NOTE — Telephone Encounter (Signed)
°  Follow up Call-     02/24/2024    2:02 PM  Call back number  Post procedure Call Back phone  # [phone]  Permission to leave phone message Yes     Patient questions:  Do you have a fever, pain , or abdominal swelling? No. Pain Score  0 *  Have you tolerated food without any problems? Yes.    Have you been able to return to your normal activities? Yes.    Do you have any questions about your discharge instructions: Diet   No. Medications  No. Follow up visit  No.  Do you have questions or concerns about your Care? No.  Actions: * If pain score is 4 or above: No action needed, pain <4.

## 2024-02-27 LAB — SURGICAL PATHOLOGY

## 2024-02-28 ENCOUNTER — Ambulatory Visit: Payer: Self-pay | Admitting: Gastroenterology

## 2024-04-13 ENCOUNTER — Other Ambulatory Visit (HOSPITAL_COMMUNITY)
